# Patient Record
Sex: Female | Born: 1935
Health system: Southern US, Community
[De-identification: ages and names within clinical notes are randomized; demographics above are authoritative.]

## PROBLEM LIST (undated history)

## (undated) DIAGNOSIS — D649 Anemia, unspecified: Secondary | ICD-10-CM

## (undated) DIAGNOSIS — I10 Essential (primary) hypertension: Secondary | ICD-10-CM

## (undated) DIAGNOSIS — T7840XA Allergy, unspecified, initial encounter: Secondary | ICD-10-CM

## (undated) DIAGNOSIS — Z8619 Personal history of other infectious and parasitic diseases: Secondary | ICD-10-CM

## (undated) DIAGNOSIS — M8580 Other specified disorders of bone density and structure, unspecified site: Secondary | ICD-10-CM

## (undated) DIAGNOSIS — R011 Cardiac murmur, unspecified: Secondary | ICD-10-CM

## (undated) HISTORY — DX: Essential (primary) hypertension: I10

## (undated) HISTORY — DX: Other specified disorders of bone density and structure, unspecified site: M85.80

## (undated) HISTORY — DX: Anemia, unspecified: D64.9

## (undated) HISTORY — DX: Personal history of other infectious and parasitic diseases: Z86.19

## (undated) HISTORY — DX: Allergy, unspecified, initial encounter: T78.40XA

## (undated) HISTORY — DX: Cardiac murmur, unspecified: R01.1

---

## 1966-06-26 HISTORY — PX: RIGHT OOPHORECTOMY: SHX2359

## 2009-08-14 ENCOUNTER — Emergency Department (HOSPITAL_BASED_OUTPATIENT_CLINIC_OR_DEPARTMENT_OTHER): Admission: EM | Admit: 2009-08-14 | Discharge: 2009-08-14 | Payer: Self-pay | Admitting: Emergency Medicine

## 2009-08-25 ENCOUNTER — Ambulatory Visit: Payer: Self-pay | Admitting: Family

## 2009-08-25 ENCOUNTER — Ambulatory Visit (HOSPITAL_BASED_OUTPATIENT_CLINIC_OR_DEPARTMENT_OTHER): Admission: RE | Admit: 2009-08-25 | Discharge: 2009-08-25 | Payer: Self-pay | Admitting: Internal Medicine

## 2009-08-25 ENCOUNTER — Ambulatory Visit: Payer: Self-pay | Admitting: Diagnostic Radiology

## 2009-08-25 DIAGNOSIS — J309 Allergic rhinitis, unspecified: Secondary | ICD-10-CM | POA: Insufficient documentation

## 2009-08-25 DIAGNOSIS — D649 Anemia, unspecified: Secondary | ICD-10-CM

## 2009-08-25 DIAGNOSIS — I10 Essential (primary) hypertension: Secondary | ICD-10-CM | POA: Insufficient documentation

## 2009-08-25 LAB — CONVERTED CEMR LAB
ALT: 9 units/L (ref 0–35)
AST: 15 units/L (ref 0–37)
Albumin: 4.1 g/dL (ref 3.5–5.2)
CO2: 30 meq/L (ref 19–32)
Calcium: 9.1 mg/dL (ref 8.4–10.5)
MCV: 82.2 fL (ref 78.0–100.0)
Platelets: 276 10*3/uL (ref 150–400)
RDW: 14.7 % (ref 11.5–15.5)
Total Protein: 7.3 g/dL (ref 6.0–8.3)
WBC: 5 10*3/uL (ref 4.0–10.5)

## 2009-08-26 ENCOUNTER — Encounter (INDEPENDENT_AMBULATORY_CARE_PROVIDER_SITE_OTHER): Payer: Self-pay | Admitting: *Deleted

## 2009-08-27 ENCOUNTER — Telehealth: Payer: Self-pay | Admitting: Family

## 2009-08-27 ENCOUNTER — Encounter: Payer: Self-pay | Admitting: Family

## 2009-08-30 ENCOUNTER — Encounter: Payer: Self-pay | Admitting: Internal Medicine

## 2009-08-30 ENCOUNTER — Ambulatory Visit: Payer: Self-pay | Admitting: Family

## 2009-08-30 ENCOUNTER — Ambulatory Visit: Payer: Self-pay | Admitting: Internal Medicine

## 2009-08-30 LAB — CONVERTED CEMR LAB
LDL Cholesterol: 123 mg/dL — ABNORMAL HIGH (ref 0–99)
Total CHOL/HDL Ratio: 2.7
VLDL: 8 mg/dL (ref 0–40)

## 2009-08-31 ENCOUNTER — Encounter: Payer: Self-pay | Admitting: Family

## 2009-08-31 ENCOUNTER — Encounter (INDEPENDENT_AMBULATORY_CARE_PROVIDER_SITE_OTHER): Payer: Self-pay | Admitting: *Deleted

## 2009-09-02 ENCOUNTER — Encounter: Payer: Self-pay | Admitting: Family

## 2009-09-02 LAB — CONVERTED CEMR LAB: Saturation Ratios: 10 % — ABNORMAL LOW (ref 20–55)

## 2009-09-07 ENCOUNTER — Other Ambulatory Visit: Admission: RE | Admit: 2009-09-07 | Discharge: 2009-09-07 | Payer: Self-pay | Admitting: Internal Medicine

## 2009-09-07 ENCOUNTER — Ambulatory Visit: Payer: Self-pay | Admitting: Family

## 2009-09-07 LAB — CONVERTED CEMR LAB
BUN: 16 mg/dL (ref 6–23)
CO2: 30 meq/L (ref 19–32)
Calcium: 9.5 mg/dL (ref 8.4–10.5)
Creatinine, Ser: 0.79 mg/dL (ref 0.40–1.20)
Glucose, Bld: 120 mg/dL — ABNORMAL HIGH (ref 70–99)
Pap Smear: NEGATIVE
Potassium: 3.8 meq/L (ref 3.5–5.3)

## 2009-09-09 ENCOUNTER — Ambulatory Visit: Payer: Self-pay | Admitting: Internal Medicine

## 2009-09-13 ENCOUNTER — Ambulatory Visit: Payer: Self-pay | Admitting: Family

## 2009-09-21 ENCOUNTER — Ambulatory Visit: Payer: Self-pay | Admitting: Family

## 2009-09-21 LAB — CONVERTED CEMR LAB
Chloride: 101 meq/L (ref 96–112)
Glucose, Bld: 89 mg/dL (ref 70–99)
Sodium: 142 meq/L (ref 135–145)

## 2009-10-05 ENCOUNTER — Ambulatory Visit: Payer: Self-pay | Admitting: Family

## 2009-10-05 ENCOUNTER — Encounter (INDEPENDENT_AMBULATORY_CARE_PROVIDER_SITE_OTHER): Payer: Self-pay | Admitting: *Deleted

## 2009-10-05 DIAGNOSIS — R011 Cardiac murmur, unspecified: Secondary | ICD-10-CM | POA: Insufficient documentation

## 2009-10-05 LAB — CONVERTED CEMR LAB
CO2: 29 meq/L (ref 19–32)
Calcium: 9.4 mg/dL (ref 8.4–10.5)
Chloride: 106 meq/L (ref 96–112)
Creatinine, Ser: 0.8 mg/dL (ref 0.40–1.20)
Glucose, Bld: 94 mg/dL (ref 70–99)

## 2009-10-11 ENCOUNTER — Telehealth: Payer: Self-pay | Admitting: Family

## 2009-10-25 ENCOUNTER — Ambulatory Visit: Payer: Self-pay | Admitting: Cardiology

## 2009-10-25 ENCOUNTER — Ambulatory Visit: Payer: Self-pay

## 2009-10-25 ENCOUNTER — Encounter: Payer: Self-pay | Admitting: Cardiology

## 2009-10-25 ENCOUNTER — Ambulatory Visit (HOSPITAL_COMMUNITY): Admission: RE | Admit: 2009-10-25 | Discharge: 2009-10-25 | Payer: Self-pay | Admitting: Family

## 2009-10-26 ENCOUNTER — Telehealth: Payer: Self-pay | Admitting: Family

## 2009-10-28 ENCOUNTER — Encounter: Payer: Self-pay | Admitting: Family

## 2010-01-28 ENCOUNTER — Ambulatory Visit: Payer: Self-pay | Admitting: Family

## 2010-01-28 LAB — CONVERTED CEMR LAB
Basophils Absolute: 0 10*3/uL (ref 0.0–0.1)
Calcium: 9 mg/dL (ref 8.4–10.5)
Chloride: 104 meq/L (ref 96–112)
Creatinine, Ser: 0.87 mg/dL (ref 0.40–1.20)
Eosinophils Relative: 2 % (ref 0–5)
Lymphs Abs: 1.7 10*3/uL (ref 0.7–4.0)
MCHC: 30.9 g/dL (ref 30.0–36.0)
MCV: 86.2 fL (ref 78.0–100.0)
Monocytes Absolute: 0.3 10*3/uL (ref 0.1–1.0)
Monocytes Relative: 6 % (ref 3–12)
Neutrophils Relative %: 53 % (ref 43–77)
Potassium: 4 meq/L (ref 3.5–5.3)
RBC: 4.05 M/uL (ref 3.87–5.11)
WBC: 4.3 10*3/uL (ref 4.0–10.5)

## 2010-01-31 ENCOUNTER — Telehealth: Payer: Self-pay | Admitting: Family

## 2010-03-09 ENCOUNTER — Telehealth: Payer: Self-pay | Admitting: Family

## 2010-03-30 ENCOUNTER — Ambulatory Visit: Payer: Self-pay | Admitting: Family

## 2010-03-30 DIAGNOSIS — M899 Disorder of bone, unspecified: Secondary | ICD-10-CM

## 2010-03-30 DIAGNOSIS — M949 Disorder of cartilage, unspecified: Secondary | ICD-10-CM

## 2010-03-30 DIAGNOSIS — M8580 Other specified disorders of bone density and structure, unspecified site: Secondary | ICD-10-CM | POA: Insufficient documentation

## 2010-03-30 LAB — CONVERTED CEMR LAB
Basophils Absolute: 0 10*3/uL (ref 0.0–0.1)
Eosinophils Relative: 2 % (ref 0–5)
HCT: 35.3 % — ABNORMAL LOW (ref 36.0–46.0)
Hemoglobin: 11 g/dL — ABNORMAL LOW (ref 12.0–15.0)
Lymphocytes Relative: 38 % (ref 12–46)
Lymphs Abs: 1.7 10*3/uL (ref 0.7–4.0)
Monocytes Relative: 6 % (ref 3–12)
Platelets: 228 10*3/uL (ref 150–400)
WBC: 4.6 10*3/uL (ref 4.0–10.5)

## 2010-04-01 ENCOUNTER — Telehealth: Payer: Self-pay | Admitting: Family

## 2010-04-01 DIAGNOSIS — E559 Vitamin D deficiency, unspecified: Secondary | ICD-10-CM | POA: Insufficient documentation

## 2010-04-04 ENCOUNTER — Ambulatory Visit: Payer: Self-pay | Admitting: Hematology & Oncology

## 2010-04-21 ENCOUNTER — Telehealth: Payer: Self-pay | Admitting: Family

## 2010-05-10 ENCOUNTER — Ambulatory Visit: Payer: Self-pay | Admitting: Hematology & Oncology

## 2010-05-11 ENCOUNTER — Encounter: Payer: Self-pay | Admitting: Family

## 2010-05-11 LAB — CBC WITH DIFFERENTIAL (CANCER CENTER ONLY)
Eosinophils Absolute: 0.1 10*3/uL (ref 0.0–0.5)
HCT: 33.2 % — ABNORMAL LOW (ref 34.8–46.6)
HGB: 11.2 g/dL — ABNORMAL LOW (ref 11.6–15.9)
LYMPH#: 1.7 10*3/uL (ref 0.9–3.3)
MCH: 27.7 pg (ref 26.0–34.0)
MCV: 82 fL (ref 81–101)
MONO#: 0.3 10*3/uL (ref 0.1–0.9)
NEUT#: 2.2 10*3/uL (ref 1.5–6.5)
Platelets: 257 10*3/uL (ref 145–400)
RBC: 4.03 10*6/uL (ref 3.70–5.32)
WBC: 4.3 10*3/uL (ref 3.9–10.0)

## 2010-05-11 LAB — CHCC SATELLITE - SMEAR

## 2010-05-13 LAB — PROTEIN ELECTROPHORESIS, SERUM
Albumin ELP: 54.7 % — ABNORMAL LOW (ref 55.8–66.1)
Alpha-1-Globulin: 4.3 % (ref 2.9–4.9)
Alpha-2-Globulin: 11 % (ref 7.1–11.8)
Beta Globulin: 6 % (ref 4.7–7.2)
Gamma Globulin: 17.7 % (ref 11.1–18.8)
Total Protein, Serum Electrophoresis: 7 g/dL (ref 6.0–8.3)

## 2010-05-13 LAB — COMPREHENSIVE METABOLIC PANEL
ALT: 10 U/L (ref 0–35)
CO2: 29 mEq/L (ref 19–32)
Calcium: 9.3 mg/dL (ref 8.4–10.5)
Chloride: 105 mEq/L (ref 96–112)
Glucose, Bld: 102 mg/dL — ABNORMAL HIGH (ref 70–99)
Sodium: 143 mEq/L (ref 135–145)
Total Protein: 7 g/dL (ref 6.0–8.3)

## 2010-05-13 LAB — IRON AND TIBC
%SAT: 36 % (ref 20–55)
Iron: 117 ug/dL (ref 42–145)
TIBC: 325 ug/dL (ref 250–470)
UIBC: 208 ug/dL

## 2010-05-13 LAB — HAPTOGLOBIN: Haptoglobin: 142 mg/dL (ref 16–200)

## 2010-05-13 LAB — RETICULOCYTES (CHCC)
ABS Retic: 24.5 10*3/uL (ref 19.0–186.0)
RBC.: 4.08 MIL/uL (ref 3.87–5.11)

## 2010-05-13 LAB — VITAMIN B12: Vitamin B-12: 232 pg/mL (ref 211–911)

## 2010-06-03 ENCOUNTER — Ambulatory Visit: Payer: Self-pay | Admitting: Family

## 2010-07-26 NOTE — Assessment & Plan Note (Signed)
Summary: 2 WEEK FOLLOW UP/MHF   Vital Signs:  Patient profile:   75 year old female Height:      64 inches Weight:      155.50 pounds BMI:     26.79 Temp:     98.1 degrees F oral Pulse rate:   84 / minute Pulse rhythm:   regular Resp:     16 per minute BP sitting:   148 / 80  (right arm) Cuff size:   regular  Vitals Entered By: [name] CMA (October 05, 2009 10:29 AM) CC: room 4  2 week follow up   CC:  room 4  2 week follow up.  History of Present Illness: Brandy Stevens is a 75 year old female who presents today for follow up of her HTN.  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  She has no complaints today and is tolerating the medication.  She  has been checking her blood pressure daily at home on her home BP monitor.  Notes readings: 111/60 on Thursday, yesterday BP 140/82, this AM 143/83- notes another reading of 125/73.    Allergies (verified): No Known Drug Allergies  Physical Exam  General:  Well-developed,well-nourished,in no acute distress; alert,appropriate and cooperative throughout examination Lungs:  Normal respiratory effort, chest expands symmetrically. Lungs are clear to auscultation, no crackles or wheezes. Heart:  Grade 2 systolic murmur noted   Impression & Recommendations:  Problem # 1:  HYPERTENSION (ICD-401.9) Assessment Improved BP is improving.  By patient report BP has varied.  Will continue to monitor.  If SBP remains greater than 140 plan to change Tribenzor to 40-5-12.5.  Pt instructed to call in 1 week with BP readings.  Check BMET today Her updated medication list for this problem includes:    Tribenzor 20-5-12.5 Mg Tabs (Olmesartan-amlodipine-hctz) ..... One tablet by mouth daily  BP today: 148/80 Prior BP: 158/84 (09/21/2009)  Labs Reviewed: K+: 4.5 (09/21/2009) Creat: : 0.81 (09/21/2009)   Chol: 206 (08/30/2009)   HDL: 75 (08/30/2009)   LDL: 123 (08/30/2009)   TG: 38 (08/30/2009)  Orders: T-Basic Metabolic Panel  ([id number])  Problem # 2:  HEART MURMUR, SYSTOLIC (ICD-785.2) Assessment: New Patient denies hx or murmur and has never had a 2-D echo.  Will check 2-d Echo to evaluate.   Orders: Misc. Referral (Misc. Ref)  Complete Medication List: 1)  Daily Combo Multivits/calcium Tabs (Multiple vitamins-calcium) .... Take 1 tablet by mouth once a day 2)  Fish Oil 1000 Mg Caps (Omega-3 fatty acids) .... Take 1 capsule by mouth once a day 3)  Ferrous Sulfate 325 (65 Fe) Mg Tabs (Ferrous sulfate) .... Take 1 tablet by mouth once a day 4)  Tribenzor 20-5-12.5 Mg Tabs (Olmesartan-amlodipine-hctz) .... One tablet by mouth daily  Patient Instructions: 1)  Please check Blood pressure daily, call on Monday 4/19 with the results of your daily BP checks.  You may call Bethann Berkshire [phone]. 2)  Keep a low sodium diet. 3)  Please schedule a follow-up appointment in 2 months. Prescriptions: TRIBENZOR 20-5-12.5 MG TABS (OLMESARTAN-AMLODIPINE-HCTZ) one tablet by mouth daily  #30 x 1   Entered and Authorized by:   Lemont Fillers FNP   Signed by:   Lemont Fillers FNP on 10/05/2009   Method used:   Electronically to        [facility] [address] (retail)       [address] [place]  [place], [place]  [address]       Ph: [phone] or [phone]       Fax: [phone]   RxID:   [id number]   Current Allergies (reviewed today): No known allergies

## 2010-07-26 NOTE — Letter (Signed)
Summary: [facility]: Immunoassay Fecal Occult Blood (iFOB) Order Psychologist, counselling at [facility]  [address]   [place], [place] [address]   Phone: [phone]  Fax: [phone]      [facility]: Immunoassay Fecal Occult Blood (iFOB) Order Form   August 31, 2009 MRN: [id number]    Brandy Stevens 09-19-1935    Physicican Name:______Melissa O'Sullivan_____  Diagnosis Code:______285.9___________________      [name] CMA

## 2010-07-26 NOTE — Assessment & Plan Note (Signed)
Summary: pap and blood pressure check/mhf   Vital Signs:  Patient profile:   75 year old female Weight:      152 pounds BMI:     26.19 O2 Sat:      98 % on Room air Temp:     98.2 degrees F oral Pulse rate:   95 / minute Pulse rhythm:   regular Resp:     16 per minute BP sitting:   170 / 80  (right arm) Cuff size:   regular  Vitals Entered By: [name] CMA (September 07, 2009 2:45 PM)  O2 Flow:  Room air CC: room 5  Pt here today for Pap Smear and blood pressure check.   CC:  room 5  Pt here today for Pap Smear and blood pressure check..  History of Present Illness: Brandy Stevens is a 75 year old female who presents today for follow up of her blood pressure and to have her Pap smear.  Last visit she was started on HCTZ and tells me that she has been taking this medication regularly.  She notes that she has not yet started the iron supplement for her anemia, but plans to start this medicine.    Allergies (verified): No Known Drug Allergies  Physical Exam  General:  Well-developed,well-nourished,in no acute distress; alert,appropriate and cooperative throughout examination Head:  Normocephalic and atraumatic without obvious abnormalities. No apparent alopecia or balding. Breasts:  No mass, nodules, thickening, tenderness, bulging, retraction, inflamation, nipple discharge or skin changes noted.   Abdomen:  Bowel sounds positive,abdomen soft and non-tender without masses, organomegaly or hernias noted. Genitalia:  Pelvic Exam:        External: normal female genitalia without lesions or masses        Vagina: normal without lesions or masses        Cervix: normal without lesions or masses        Adnexa: normal bimanual exam without masses or fullness        Uterus: normal by palpation        Pap smear: performed   Impression & Recommendations:  Problem # 1:  HYPERTENSION (ICD-401.9) Assessment Improved  BP is improved today but not yet at goal.  Add Ace, f/u in 2 weeks- pt  advised to d/c med and to to er if tongue/lip swelling. Her updated medication list for this problem includes:    Hydrochlorothiazide 25 Mg Tabs (Hydrochlorothiazide) ..... One tablet by mouth daily    Lisinopril 10 Mg Tabs (Lisinopril) ..... One tablet by mouth daily  BP today: 170/80 Prior BP: 178/100 (08/25/2009)  Labs Reviewed: K+: 4.0 (08/25/2009) Creat: : 0.76 (08/25/2009)   Chol: 206 (08/30/2009)   HDL: 75 (08/30/2009)   LDL: 123 (08/30/2009)   TG: 38 (08/30/2009)  Orders: TLB-BMP (Basic Metabolic Panel-BMET) ([id number]) Prescription Created Electronically [phone])  Complete Medication List: 1)  Daily Combo Multivits/calcium Tabs (Multiple vitamins-calcium) .... Take 1 tablet by mouth once a day 2)  Hydrochlorothiazide 25 Mg Tabs (Hydrochlorothiazide) .... One tablet by mouth daily 3)  Lisinopril 10 Mg Tabs (Lisinopril) .... One tablet by mouth daily  Other Orders: Pelvic & Breast Exam ( Medicare)  (J1914)  Patient Instructions: 1)  Please follow up in 2 weeks for a nurse visit  and BMET 402.9 (labs)to have your blood pressure checked. 2)  We will mail you the results of your pap smear. Prescriptions: LISINOPRIL 10 MG TABS (LISINOPRIL) one tablet by mouth daily  #30 x 0   Entered and  Authorized by:   Lemont Fillers FNP   Signed by:   Lemont Fillers FNP on 09/07/2009   Method used:   Electronically to        [facility] [address] (retail)       [address] [place], [place]  [address]       Ph: [phone] or [phone]       Fax: [phone]   RxID:   [id number]   Current Allergies (reviewed today): No known allergies

## 2010-07-26 NOTE — Letter (Signed)
   Nature conservation officer at [facility] [address] [place], [place] [address]    August 31, 2009   Kaitlynne Bacci [address] [place], [place] [address]  RE:  LAB RESULTS  Dear  Ms. Folger,  The following is an interpretation of your most recent lab tests.  Please take note of any instructions provided or changes to medications that have resulted from your lab work.  ELECTROLYTES:  Good - no changes needed  KIDNEY FUNCTION TESTS:  Good - no changes needed  LIPID PANEL:  Fair - review at your next visit Triglyceride: 38   Cholesterol: 206   LDL: 123   HDL: 75   Chol/HDL%:  2.7 Ratio  THYROID STUDIES:  Thyroid studies normal TSH: 0.849     DIABETIC STUDIES:  Excellent - no changes needed Blood Glucose: 83    CBC:  Fair - review at your next visit Your cholesterol is slightly elevated.  Please eat a low fat, low cholesterol diet.   Also, you are slightly anemic- please arrange a follow up appointment for some additional lab work.   Sincerely Yours,    Lemont Fillers FNP

## 2010-07-26 NOTE — Consult Note (Signed)
Summary: [facility]  [facility]   Imported By: Lanelle Bal 05/24/2010 12:35:51  _____________________________________________________________________  External Attachment:    Type:   Image     Comment:   External Document

## 2010-07-26 NOTE — Assessment & Plan Note (Signed)
Summary: bp check and bmet/mhf   Vital Signs:  Patient profile:   75 year old female Height:      64 inches Weight:      153.50 pounds BMI:     26.44 Temp:     98.4 degrees F oral Pulse rate:   92 / minute Pulse rhythm:   regular Resp:     18 per minute BP sitting:   158 / 84  (right arm) Cuff size:   regular  Vitals Entered By: [name] CMA (September 21, 2009 2:12 PM) CC: room 6  Follow up of blood pressure and needs blood work (bmet) today.   CC:  room 6  Follow up of blood pressure and needs blood work (bmet) today.Marland Kitchen  History of Present Illness: Brandy Stevens is a 75 year old female who presents today for follow up of her blood pressure.  Notes that she has been monitoring her blood pressure at home and has been getting SBP readings of 138-158.  Tolerating meds without difficulty, denies cough or lower extremity swelling.    Allergies: No Known Drug Allergies  Physical Exam  General:  Well-developed,well-nourished,in no acute distress; alert,appropriate and cooperative throughout examination Extremities:  No clubbing, cyanosis, edema, or deformity noted with normal full range of motion of all joints.   Psych:  Cognition and judgment appear intact. Alert and cooperative with normal attention span and concentration. No apparent delusions, illusions, hallucinations   Impression & Recommendations:  Problem # 1:  HYPERTENSION (ICD-401.9) Assessment Improved  BP is improved, but not yet at goal.  Will plan to change to tribenzor, check BMET today, follow up in 2 weeks The following medications were removed from the medication list:    Hydrochlorothiazide 25 Mg Tabs (Hydrochlorothiazide) ..... One tablet by mouth daily    Lisinopril 10 Mg Tabs (Lisinopril) ..... One tablet by mouth daily Her updated medication list for this problem includes:    Tribenzor 20-5-12.5 Mg Tabs (Olmesartan-amlodipine-hctz) ..... One tablet by mouth daily  BP today: 158/84 Prior BP: 170/80  (09/07/2009)  Labs Reviewed: K+: 3.8 (09/07/2009) Creat: : 0.79 (09/07/2009)   Chol: 206 (08/30/2009)   HDL: 75 (08/30/2009)   LDL: 123 (08/30/2009)   TG: 38 (08/30/2009)  Orders: T-Basic Metabolic Panel [id number])  Complete Medication List: 1)  Daily Combo Multivits/calcium Tabs (Multiple vitamins-calcium) .... Take 1 tablet by mouth once a day 2)  Fish Oil 1000 Mg Caps (Omega-3 fatty acids) .... Take 1 capsule by mouth once a day 3)  Ferrous Sulfate 325 (65 Fe) Mg Tabs (Ferrous sulfate) .... Take 1 tablet by mouth once a day 4)  Tribenzor 20-5-12.5 Mg Tabs (Olmesartan-amlodipine-hctz) .... One tablet by mouth daily  Patient Instructions: 1)  Please schedule a follow-up appointment in 2 weeks. Prescriptions: TRIBENZOR 20-5-12.5 MG TABS (OLMESARTAN-AMLODIPINE-HCTZ) one tablet by mouth daily  #30 x 0   Entered and Authorized by:   Brandy Fillers FNP   Signed by:   Brandy Fillers FNP on 09/21/2009   Method used:   Electronically to        [facility] [address] (retail)       [address] [place], [place]  [address]       Ph: [phone] or [phone]       Fax: [phone]   RxID:   [id number]   Current Allergies (reviewed today): No known allergies

## 2010-07-26 NOTE — Letter (Signed)
Summary: [facility] Instructions  [facility]  [address] [place], [place] [address]   Phone: [phone]  Fax: [phone]       Brandy Stevens    1935/09/17    MRN: [id number]        Procedure Day Dorna Bloom:  Lenor Coffin  09/09/09     Arrival Time:  12:30PM     Procedure Time:  1:30PM     Location of Procedure:                    _ X_  [facility] (4th Floor)   PREPARATION FOR COLONOSCOPY WITH MOVIPREP   Starting 5 days prior to your procedure 09/04/09 do not eat nuts, seeds, popcorn, corn, beans, peas,  salads, or any raw vegetables.  Do not take any fiber supplements (e.g. Metamucil, Citrucel, and Benefiber).  THE DAY BEFORE YOUR PROCEDURE         DATE: 09/08/09  DAY: WEDNESDAY  1.  Drink clear liquids the entire day-NO SOLID FOOD  2.  Do not drink anything colored red or purple.  Avoid juices with pulp.  No orange juice.  3.  Drink at least 64 oz. (8 glasses) of fluid/clear liquids during the day to prevent dehydration and help the prep work efficiently.  CLEAR LIQUIDS INCLUDE: Water Jello Ice Popsicles Tea (sugar ok, no milk/cream) Powdered fruit flavored drinks Coffee (sugar ok, no milk/cream) Gatorade Juice: apple, white grape, white cranberry  Lemonade Clear bullion, consomm, broth Carbonated beverages (any kind) Strained chicken noodle soup Hard Candy                             4.  In the morning, mix first dose of MoviPrep solution:    Empty 1 Pouch A and 1 Pouch B into the disposable container    Add lukewarm drinking water to the top line of the container. Mix to dissolve    Refrigerate (mixed solution should be used within 24 hrs)  5.  Begin drinking the prep at 5:00 p.m. The MoviPrep container is divided by 4 marks.   Every 15 minutes drink the solution down to the next mark (approximately 8 oz) until the full liter is complete.   6.  Follow completed prep with 16 oz of clear liquid of your choice (Nothing red or purple).   Continue to drink clear liquids until bedtime.  7.  Before going to bed, mix second dose of MoviPrep solution:    Empty 1 Pouch A and 1 Pouch B into the disposable container    Add lukewarm drinking water to the top line of the container. Mix to dissolve    Refrigerate  THE DAY OF YOUR PROCEDURE      DATE: 09/09/09  DAY: THURSDAY  Beginning at 8:30a.m. (5 hours before procedure):         1. Every 15 minutes, drink the solution down to the next mark (approx 8 oz) until the full liter is complete.  2. Follow completed prep with 16 oz. of clear liquid of your choice.    3. You may drink clear liquids until 11:30AM (2 HOURS BEFORE PROCEDURE).   MEDICATION INSTRUCTIONS  Unless otherwise instructed, you should take regular prescription medications with a small sip of water   as early as possible the morning of your procedure.   Additional medication instructions:Hold HCTZ morning of procedure.         OTHER  INSTRUCTIONS  You will need a responsible adult at least 73 years of age to accompany you and drive you home.   This person must remain in the waiting room during your procedure.  Wear loose fitting clothing that is easily removed.  Leave jewelry and other valuables at home.  However, you may wish to bring a book to read or  an iPod/MP3 player to listen to music as you wait for your procedure to start.  Remove all body piercing jewelry and leave at home.  Total time from sign-in until discharge is approximately 2-3 hours.  You should go home directly after your procedure and rest.  You can resume normal activities the  day after your procedure.  The day of your procedure you should not:   Drive   Make legal decisions   Operate machinery   Drink alcohol   Return to work  You will receive specific instructions about eating, activities and medications before you leave.    The above instructions have been reviewed and explained to me by   Ezra Sites RN   August 30, 2009 2:58 PM    I fully understand and can verbalize these instructions _____________________________ Date _________

## 2010-07-26 NOTE — Letter (Signed)
Summary: Primary Care Consult Scheduled Letter  [place] [facility]  [address]   [place], [place] [address]   Phone: [phone]  Fax: [phone]      10/05/2009 MRN: [id number]  Brandy Stevens [address] [place], [place]  [address]    Dear [name],      We have scheduled an appointment for you.  At the recommendation of MELISSA O'SULLIVAN,FNP, we have scheduled you a 2D-ECHO ,[facility] on MAY 2  at 11:30AM .  Their address is_[facility],  [address] , [place] . The office phone number is [phone].  If this appointment day and time is not convenient for you, please feel free to call the office of the doctor you are being referred to at the number listed above and reschedule the appointment.     It is important for you to keep your scheduled appointments. We are here to make sure you are given good patient care. If you have questions or you have made changes to your appointment, please notify us at  [phone], ask for HELEN.    Thank you, Darral Dash Patient Care Coordinator [place] at [facility]

## 2010-07-26 NOTE — Progress Notes (Signed)
Summary: lab result, hematology referral  Phone Note Outgoing Call   Summary of Call: Please call patient and let her know that her iron is normal.  She should continue iron supplement.  She remains mildly anemic.  I would like for her to see Dr. Myna Hidalgo (hematology) to see if he has any further suggestions.  Also, her vitamin D is a little low.  I would like her to add a vitamin D supplement as below.   Initial call taken by: Lemont Fillers FNP,  April 01, 2010 8:18 AM  Follow-up for Phone Call        Notified pt of Jaceon Heiberger instructions. Pt stated she wanted to wait until December after checking labs again.  Advised pt that the anemia was present from 3/11 at her first visit with [place]. Reviewed iron supplement directions and pt states that she has only been taking her iron two times a day. Please advise. Nicki Guadalajara Fergerson CMA [facility] Dull)  April 01, 2010 8:58 AM   Additional Follow-up for Phone Call Additional follow up Details #1::        Her iron is normal.  I don't expect that her blood count is going to improve between now and December and she has bee anemic for a long time  I would advise her to see Dr. Myna Hidalgo sooner than later.  I want to make sure that we aren't missing a more serious cause for her anemia. You can let her know that his office is right across the hall from ours. Additional Follow-up by: Lemont Fillers FNP,  April 01, 2010 9:08 AM  New Problems: VITAMIN D DEFICIENCY (ICD-268.9)   Additional Follow-up for Phone Call Additional follow up Details #2::    Notified pt per Wendy Hoback's instructions. Pt voices understanding. Nicki Guadalajara Fergerson CMA Duncan Dull)  April 01, 2010 9:35 AM   New Problems: VITAMIN D DEFICIENCY (ICD-268.9) New/Updated Medications: VITAMIN D 1000 UNIT TABS (CHOLECALCIFEROL) 3 caps by mouth once daily

## 2010-07-26 NOTE — Progress Notes (Signed)
  Phone Note Outgoing Call   Summary of Call: Left message with patient's family member for her to return my call.  When she calls back I would like to let her know that her echocardiogram looks good.  Initial call taken by: Lemont Fillers FNP,  Oct 26, 2009 11:15 AM

## 2010-07-26 NOTE — Assessment & Plan Note (Signed)
Summary: FU VISIT/DT--Rm 5   Vital Signs:  Patient profile:   75 year old female Height:      64 inches Weight:      158.25 pounds Temp:     98.2 degrees F oral Pulse rate:   72 / minute Pulse rhythm:   regular Resp:     16 per minute BP sitting:   140 / 78  (right arm)  Vitals Entered By: [name] CMA Duncan Dull) (January 28, 2010 1:17 PM)   CC:  Room 5  Follow up. Pt has bp monitor from home to compare readings. and Hypertension Management.  History of Present Illness: Brandy Stevens is a 75 year old female who presents today for follow up of her blood pressure.  1) HTN- Notes BP readings at home have ranged 121-130's/70's.   She is tolerating her medication without difficulty.   2) Anemia- reports that she continues to take iron.  Had negative colonoscopy.  Hypertension History:      She denies headache, chest pain, palpitations, dyspnea with exertion, and peripheral edema.        Positive major cardiovascular risk factors include female age 16 years old or older and hypertension.  Negative major cardiovascular risk factors include non-tobacco-user status.     Allergies (verified): No Known Drug Allergies  Past History:  Past Medical History: Last updated: September 24, 2009 chicken pox UTI Allergic rhinitis Anemia-NOS Hypertension  Past Surgical History: Last updated: 2009-09-24 Right oophorectomy 1968  Family History: Last updated: 24-Sep-2009 Mom- deceased,  died from complications of hip fracture- 73 Dad- deceased, died at 80, ?pneumonia 7 brothers- two deceased (oldest brother died suddenly- cause unknown)  other brother died from ? pneumonia 5 living brothers- alive and well  One son age 69- alive and well  Social History: Last updated: 09/24/2009 Works as an Public house manager at [employer] Never smoked denies ETOH denies drug use never smoked widowed  Risk Factors: Smoking Status: never (2009-09-24)  Review of Systems       see HPI   Physical  Exam  General:  Well-developed,well-nourished,in no acute distress; alert,appropriate and cooperative throughout examination Head:  Normocephalic and atraumatic without obvious abnormalities. No apparent alopecia or balding. Neck:  No deformities, masses, or tenderness noted. Lungs:  Normal respiratory effort, chest expands symmetrically. Lungs are clear to auscultation, no crackles or wheezes. Heart:  Normal rate and regular rhythm. S1 and S2 normal.  + systolic murmur. Extremities:  No LE edema Psych:  Oriented X3.     Impression & Recommendations:  Problem # 1:  HYPERTENSION (ICD-401.9) Assessment Unchanged Home BP readings are lower than here.  Will plan to have patient continue current dose of tribenzor.  Check BMET.   Her updated medication list for this problem includes:    Tribenzor 20-5-12.5 Mg Tabs (Olmesartan-amlodipine-hctz) ..... One tablet by mouth daily  Orders: T-Basic Metabolic Panel [id number])  BP today: 140/78 Prior BP: 148/80 (10/05/2009)  10 Yr Risk Heart Disease: 9 %  Labs Reviewed: K+: 4.4 (10/05/2009) Creat: : 0.80 (10/05/2009)   Chol: 206 (08/30/2009)   HDL: 75 (08/30/2009)   LDL: 123 (08/30/2009)   TG: 38 (08/30/2009)  Problem # 2:  ANEMIA-NOS (ICD-285.9) Assessment: Comment Only check follow up CBC.  Continue Iron. Patient had negative FOB and negative colo. Her updated medication list for this problem includes:    Ferrous Sulfate 325 (65 Fe) Mg Tabs (Ferrous sulfate) .Marland Kitchen... Take 1 tablet by mouth once a day  Orders: T-CBC w/Diff ([id number])  Complete Medication List: 1)  Daily Combo Multivits/calcium Tabs (Multiple vitamins-calcium) .... Take 1 tablet by mouth once a day 2)  Fish Oil 1000 Mg Caps (Omega-3 fatty acids) .... Take 1 capsule by mouth once a day 3)  Ferrous Sulfate 325 (65 Fe) Mg Tabs (Ferrous sulfate) .... Take 1 tablet by mouth once a day 4)  Tribenzor 20-5-12.5 Mg Tabs (Olmesartan-amlodipine-hctz) .... One tablet by mouth  daily  Hypertension Assessment/Plan:      The patient's hypertensive risk group is category B: At least one risk factor (excluding diabetes) with no target organ damage.  Her calculated 10 year risk of coronary heart disease is 9 %.  Today's blood pressure is 140/78.  Her blood pressure goal is < 140/90.  Patient Instructions: 1)  Please complete your blood work downstairs. 2)  Follow up in 3 months.    Current Allergies (reviewed today): No known allergies   CC: Room 5  Follow up. Pt has bp monitor from home to compare readings., Hypertension Management Is Patient Diabetic? No Comments Pt states that she takes multi vit. every other day.  Brandy Stevens CMA (AAMA)  January 28, 2010 1:24 PM

## 2010-07-26 NOTE — Letter (Signed)
   [facility] [address] [place], [place]  [address]  [place] Phone: [phone]      September 13, 2009   Brandy Stevens [address] [place], [place] [address]  RE:  LAB RESULTS  Dear  Ms. Stingley,  The following is an interpretation of your most recent lab tests.  Please take note of any instructions provided or changes to medications that have resulted from your lab work.  Pap Smear: normal     Sincerely Yours,    Lemont Fillers FNP

## 2010-07-26 NOTE — Letter (Signed)
   [facility] [address] [place], [place]  [address]  [place] Phone: [phone]      Oct 28, 2009   Zyann Tretter [address] [place], [place] [address]  RE:  LAB RESULTS  Dear  Ms. Dimaano,  The following is an interpretation of your most recent lab tests.  Please take note of any instructions provided or changes to medications that have resulted from your lab work.   Your echocardiogram was normal. Please follow up in July as scheduled.   Sincerely Yours,    Lemont Fillers FNP

## 2010-07-26 NOTE — Miscellaneous (Signed)
  Clinical Lists Changes  Observations: Added new observation of MAMMOGRAM: normal (08/25/2009 12:39)      Preventive Care Screening  Mammogram:    Date:  08/25/2009    Results:  normal

## 2010-07-26 NOTE — Assessment & Plan Note (Signed)
Summary: new to be est - jr   Vital Signs:  Patient profile:   75 year old female Height:      64 inches Weight:      155.4 pounds BMI:     26.77 O2 Sat:      100 % on Room air Temp:     98.0 degrees F oral Pulse rate:   92 / minute Pulse rhythm:   regular Resp:     16 per minute BP sitting:   178 / 100  (right arm) Cuff size:   regular  Vitals Entered By: [name] CMA (August 25, 2009 1:44 PM)  O2 Flow:  Room air Is Patient Diabetic? No   History of Present Illness: Brandy Stevens is a 75 yr old female who presents here today to establish care.  Has been without a primary care physician for more than 5 yrs.  She saw Dr Colon Branch in the past and he retired.  Since that time she has gone to urgent cares and ER's as needed.  HTN- [facility] due to eye irritation following an episode of shampoo getting into her eye.  Also notes that her BP was "sky high" when she saw her dentist about 1 month ago.  She is not currently on a blood pressure medication.  Preventative-  last mammogram was greater than 5 years ago.  Colo- greater than 10 yrs ago.  Last tetanus was greater than 10 yrs ago.  Never had dexascan.  Last pap was "years ago".  Used to walk with her husband-  has not been walking   regularly.  Currently declines pneumovax.  Preventive Screening-Counseling & Management  Alcohol-Tobacco     Smoking Status: never  Allergies (verified): No Known Drug Allergies  Past History:  Past Medical History: chicken pox UTI Allergic rhinitis Anemia-NOS Hypertension  Past Surgical History: Right oophorectomy 1968  Family History: Mom- deceased,  died from complications of hip fracture- 63 Dad- deceased, died at 41, ?pneumonia 7 brothers- two deceased (oldest brother died suddenly- cause unknown)  other brother died from ? pneumonia 5 living brothers- alive and well  One son age 38- alive and well  Social History: Works as an Public house manager at  [employer] Never smoked denies ETOH denies drug use never smoked widowedSmoking Status:  never  Review of Systems       Constitutional: Denies Fever ENT:  Denies nasal congestion or sore throat. Resp: Denies cough CV:  Denies Chest Pain or SOB GI:  Denies nausea or vomitting GU: Denies dysuria Lymphatic: Denies lymphadenopathy Musculoskeletal:  Denies muscle/joint pain Skin:  Denies Rashes Psychiatric: Denies depression, occasional anxiety at work Neuro: Denies numbness     Physical Exam  General:  Slightly overweight AA female, awake, alert, NAD. appears younger than stated age Head:  Normocephalic and atraumatic without obvious abnormalities. No apparent alopecia or balding. Neck:  No deformities, masses, or tenderness noted. Breasts:  deferred to next visit Lungs:  Normal respiratory effort, chest expands symmetrically. Lungs are clear to auscultation, no crackles or wheezes. Heart:  Normal rate and regular rhythm. S1 and S2 normal without gallop, murmur, click, rub or other extra sounds. Abdomen:  Bowel sounds positive,abdomen soft and non-tender without masses, organomegaly or hernias noted. Genitalia:  deferred to next visit Msk:  No deformity or scoliosis noted of thoracic or lumbar spine.   Extremities:  No clubbing, cyanosis, edema, or deformity noted with normal  full range of motion of all joints.   Skin:  Intact without suspicious lesions or rashes Cervical Nodes:  No lymphadenopathy noted Psych:  Cognition and judgment appear intact. Alert and cooperative with normal attention span and concentration. No apparent delusions, illusions, hallucinations   Impression & Recommendations:  Problem # 1:  Preventive Health Care (ICD-V70.0) Will refer for colo, and mammogram.  Check baseline laboratories.  Patient counselled on diet exercise and weight loss.  Immunizations reviewed.  Tetanus today, refuses pneumovax. Orders: Mammogram (Screening)  (Mammo) T-Comprehensive Metabolic Panel [phone]) T-TSH [phone]) EKG w/ Interpretation (93000) Gastroenterology Referral (GI)  Problem # 2:  HYPERTENSION (ICD-401.9) Assessment: New Check BMET today, will start HCTZ, plan f/u in 2 weeks Her updated medication list for this problem includes:    Hydrochlorothiazide 25 Mg Tabs (Hydrochlorothiazide) ..... One tablet by mouth daily  Problem # 3:  ANEMIA-NOS (ICD-285.9) Assessment: Comment Only Notes remote hx of anemia- check CBC Orders: T-CBC No Diff ([id number])  Problem # 4:  ALLERGIC RHINITIS (ICD-477.9) Assessment: Improved Currently stable, monitor  Complete Medication List: 1)  Daily Combo Multivits/calcium Tabs (Multiple vitamins-calcium) .... Take 1 tablet by mouth once a day 2)  Hydrochlorothiazide 25 Mg Tabs (Hydrochlorothiazide) .... One tablet by mouth daily  Other Orders: Tdap => 63yrs IM (29562) Admin 1st Vaccine ([address])  Patient Instructions: 1)  Please schedule bone density (Dexa) test (V70) 2)  Please return fasting for cholesterol (FLP- V70) 3)  You will be called about your referral for colonoscopy 4)  Please schedule your mammogram downstairs prior to leaving today. 5)  Please return in 2 weeks for PAP smear and blood pressure check 6)  It is important that you exercise regularly at least 20 minutes 5 times a week. If you develop chest pain, have severe difficulty breathing, or feel very tired , stop exercising immediately and seek medical attention. 7)  You need to lose weight. Consider a lower calorie diet and regular exercise.  Prescriptions: HYDROCHLOROTHIAZIDE 25 MG TABS (HYDROCHLOROTHIAZIDE) one tablet by mouth daily  #30 x 0   Entered and Authorized by:   Lemont Fillers FNP   Signed by:   Lemont Fillers FNP on 08/25/2009   Method used:   Electronically to        [facility] [address] (retail)       [address] [place], [place]  [address]       Ph:  [phone] or [phone]       Fax: [phone]   RxID:   [id number]      Immunization History:  Influenza Immunization History:    Influenza:  historical (04/14/2009)  Immunizations Administered:  Tetanus Vaccine:    Vaccine Type: Tdap    Site: right deltoid    Mfr: GlaxoSmithKline    Dose: 0.5 ml    Route: IM    Given by: [name] CMA    Exp. Date: 08/21/2011    Lot #: [id number]   Current Allergies (reviewed today): No known allergies    Vital Signs:  Patient Profile:   75 year old female Height:     64 inches Weight:      155.4 pounds BMI:     26.77 O2 Sat:      100 % Temp:     98.0 degrees F oral Pulse rate:   92 / minute Pulse rhythm:   regular Resp:  16 per minute BP sitting:   178 / 100 Cuff size:   regular

## 2010-07-26 NOTE — Progress Notes (Signed)
Summary: Tribenzor refill  Phone Note Refill Request Call back at Home Phone [phone] Message from:  Patient on March 09, 2010 12:12 PM  Refills Requested: Medication #1:  TRIBENZOR 20-5-12.5 MG TABS one tablet by mouth daily.   Dosage confirmed as above?Dosage Confirmed   Brand Name Necessary? No   Supply Requested: 1 month   Last Refilled: 01/14/2010 [facility] [address]   Method Requested: Electronic Next Appointment Scheduled: 10.5.11 Initial call taken by: Lannette Donath,  March 09, 2010 12:12 PM    Prescriptions: TRIBENZOR 20-5-12.5 MG TABS [facility]) one tablet by mouth daily  #30 Tablet x 0   Entered by:   [name] CMA (AAMA)   Authorized by:   Lemont Fillers FNP   Signed by:   [name] CMA (AAMA) on 03/09/2010   Method used:   Faxed to ...       [facility] [id number] (retail)       [address]       [place], [place]  [address]       Ph: [phone] or [phone]       Fax: [phone]   RxID:   [id number]

## 2010-07-26 NOTE — Assessment & Plan Note (Signed)
Summary: 2 month f/u / tf,cma   Vital Signs:  Patient profile:   75 year old female Height:      64 inches Weight:      157.75 pounds BMI:     27.18 O2 Sat:      100 % on Room air Temp:     97.8 degrees F oral Pulse rate:   87 / minute Pulse rhythm:   regular Resp:     16 per minute BP sitting:   140 / 90  (right arm) Cuff size:   regular  Vitals Entered By: Glendell Docker CMA (March 30, 2010 2:30 PM)  O2 Flow:  Room air CC: 2 month follow up , Hypertension Management Is Patient Diabetic? No Pain Assessment Patient in pain? no      Comments flu vaccine with employer, no concerns   CC:  2 month follow up  and Hypertension Management.  History of Present Illness: Brandy Stevens is a 75 year old female who presents today for follow up of her blood pressure.  Notes that her BP's have been running 130-140/80-90.    Anemia- has been trying to eat more red meats/green leafy vegetables.  Notes occasional constipation with colace.  Taking iron as prescribed.  Hypertension History:      She denies headache, chest pain, palpitations, and peripheral edema.        Positive major cardiovascular risk factors include female age 73 years old or older and hypertension.  Negative major cardiovascular risk factors include non-tobacco-user status.     Preventive Screening-Counseling & Management  Alcohol-Tobacco     Smoking Status: never  Problems Prior to Update: 1)  Osteopenia  (ICD-733.90) 2)  Heart Murmur, Systolic  (ICD-785.2) 3)  Preventive Health Care  (ICD-V70.0) 4)  Hypertension  (ICD-401.9) 5)  Anemia-nos  (ICD-285.9) 6)  Allergic Rhinitis  (ICD-477.9)  Allergies (verified): No Known Drug Allergies  Past History:  Past Medical History: Last updated: 08/25/2009 chicken pox UTI Allergic rhinitis Anemia-NOS Hypertension  Review of Systems       see HPI  Physical Exam  General:  Well-developed,well-nourished,in no acute distress; alert,appropriate and  cooperative throughout examination Lungs:  Normal respiratory effort, chest expands symmetrically. Lungs are clear to auscultation, no crackles or wheezes. Heart:  Grade II/VI systolic murmur, I6/N6, RRR Extremities:  trace left pedal edema and trace right pedal edema.     Impression & Recommendations:  Problem # 1:  ANEMIA-NOS (ICD-285.9) Assessment Comment Only Pt with neg IFOB, negative colo, iron deficiency.  Repeat CBC and iron level. Her updated medication list for this problem includes:    Ferrous Sulfate 325 (65 Fe) Mg Tabs (Ferrous sulfate) .Marland Kitchen... Take 1 tablet by mouth three times a day  Orders: TLB-CBC Platelet - w/Differential (85025-CBCD) T-Iron (29528-41324)  Problem # 2:  HYPERTENSION (ICD-401.9) Assessment: Comment Only At goal, continue tribenzor. Her updated medication list for this problem includes:    Tribenzor 20-5-12.5 Mg Tabs (Olmesartan-amlodipine-hctz) ..... One tablet by mouth daily  BP today: 140/90 Prior BP: 140/78 (01/28/2010)  Prior 10 Yr Risk Heart Disease: 9 % (01/28/2010)  Labs Reviewed: K+: 4.0 (01/28/2010) Creat: : 0.87 (01/28/2010)   Chol: 206 (08/30/2009)   HDL: 75 (08/30/2009)   LDL: 123 (08/30/2009)   TG: 38 (08/30/2009)  Complete Medication List: 1)  Daily Combo Multivits/calcium Tabs (Multiple vitamins-calcium) .... Take 1 tablet by mouth once a day 2)  Fish Oil 1000 Mg Caps (Omega-3 fatty acids) .... Take 1 capsule by mouth  once a day 3)  Ferrous Sulfate 325 (65 Fe) Mg Tabs (Ferrous sulfate) .... Take 1 tablet by mouth three times a day 4)  Tribenzor 20-5-12.5 Mg Tabs (Olmesartan-amlodipine-hctz) .... One tablet by mouth daily 5)  Colace 100 Mg Caps (Docusate sodium) .... One cap by mouth 1-2x daily as needed  Other Orders: T-Assay of Vitamin D ([id number])  Hypertension Assessment/Plan:      The patient's hypertensive risk group is category B: At least one risk factor (excluding diabetes) with no target organ damage.  Her  calculated 10 year risk of coronary heart disease is 9 %.  Today's blood pressure is 140/90.  Her blood pressure goal is < 140/90.  Patient Instructions: 1)  Please follow up in 3 months. 2)  Have a nice Fall!  Current Allergies (reviewed today): No known allergies

## 2010-07-26 NOTE — Progress Notes (Signed)
Summary: BP readings  Phone Note Call from Patient   Caller: Patient Call For: Lemont Fillers FNP Summary of Call: Pt called with a.m. BP readings:  4/13-- 115/69                                              4/14-- 130/77                                              4/15-- 134/77                                              4/16-- 132/72                                              4/17-- 132/72                                              4/18-- 126/77 Pt states that her readings in the p.m. have been about the same.  I advised pt. to bring her monitor to her next visit for [place] to compare with our equipment.  [name] CMA  October 11, 2009 10:31 AM    Follow-up for Phone Call        Called pt. reviewed results.  Plan to continue current meds. Follow-up by: Lemont Fillers FNP,  October 11, 2009 1:36 PM

## 2010-07-26 NOTE — Miscellaneous (Signed)
Summary: [facility]  Clinical Lists Changes  Medications: Added new medication of MOVIPREP 100 GM  SOLR (PEG-KCL-NACL-NASULF-NA ASC-C) As per prep instructions. - Signed Rx of MOVIPREP 100 GM  SOLR (PEG-KCL-NACL-NASULF-NA ASC-C) As per prep instructions.;  #1 x 0;  Signed;  Entered by: Ezra Sites RN;  Authorized by: Hart Carwin MD;  Method used: Electronically to [facility] [id number] [address] [place], [place], [place]  [address], Ph: [phone] or [phone], Fax: [phone] Observations: Added new observation of NKA: T (08/30/2009 13:57)    Prescriptions: MOVIPREP 100 GM  SOLR (PEG-KCL-NACL-NASULF-NA ASC-C) As per prep instructions.  #1 x 0   Entered by:   Ezra Sites RN   Authorized by:   Hart Carwin MD   Signed by:   Ezra Sites RN on 08/30/2009   Method used:   Electronically to        [facility] [address] (retail)       [address] [place], [place]  [address]       Ph: [phone] or [phone]       Fax: [phone]   RxID:   [id number]

## 2010-07-26 NOTE — Miscellaneous (Signed)
Summary: BONE DENSITY  Clinical Lists Changes  Orders: Added new Test order of T-Bone Densitometry (77080) - Signed Added new Test order of T-Lumbar Vertebral Assessment (77082) - Signed 

## 2010-07-26 NOTE — Progress Notes (Signed)
Summary: Tribenzor refill  Phone Note Refill Request Call back at Home Phone [phone] Message from:  Patient on April 21, 2010 3:00 PM  Refills Requested: Medication #1:  TRIBENZOR 20-5-12.5 MG TABS one tablet by mouth daily   Dosage confirmed as above?Dosage Confirmed   Brand Name Necessary? No   Supply Requested: 1 month [facility]   Method Requested: Electronic Next Appointment Scheduled: 12.9.11 Initial call taken by: Lannette Donath,  April 21, 2010 3:01 PM  Follow-up for Phone Call        Pt notified rx sent to pharmacy. Nicki Guadalajara Fergerson CMA [place] [facility])  April 21, 2010 3:05 PM     Prescriptions: Marya Landry 20-5-12.5 MG TABS [facility]) one tablet by mouth daily  #30 Tablet x 2   Entered by:   [name] CMA (AAMA)   Authorized by:   Lemont Fillers FNP   Signed by:   [name] CMA (AAMA) on 04/21/2010   Method used:   Electronically to        [facility] [address] (retail)       [address]       [place], [place]  [address]       Ph: [phone]       Fax: [phone]   RxID:   [id number]

## 2010-07-26 NOTE — Progress Notes (Signed)
Summary: lab results  Phone Note Outgoing Call   Summary of Call: Please call patient and let her know that she is still anemic.  I would like for her to increase her iron to three times a day please.  She should follow up in 2 months. Initial call taken by: Lemont Fillers FNP,  January 31, 2010 8:26 AM  Follow-up for Phone Call        Pt advised per [facility] instruction and voices understanding.  Follow up scheduled for 03/30/10 @ 2:30 with Melissa. Appt reminder mailed to pt. Nicki Guadalajara Fergerson CMA (AAMA)  January 31, 2010 1:04 PM     New/Updated Medications: FERROUS SULFATE 325 (65 FE) MG TABS (FERROUS SULFATE) Take 1 tablet by mouth three times a day

## 2010-07-26 NOTE — Procedures (Signed)
Summary: Colonoscopy  Patient: [name] Note: All result statuses are Final unless otherwise noted.  Tests: (1) Colonoscopy (COL)   COL Colonoscopy           [facility]ONE      [facility]     [address]     [place], [place]  [address]           COLONOSCOPY PROCEDURE REPORT           PATIENT:  Brandy Stevens, Brandy Stevens  MR#:  [id number]     BIRTHDATE:  08/07/1935, 74 yrs. old  GENDER:  female           ENDOSCOPIST:  Hedwig Morton. Juanda Chance, MD     Referred by:  Sandford Craze, FNP           PROCEDURE DATE:  09/09/2009     PROCEDURE:  Colonoscopy [address]     ASA CLASS:  Class I     INDICATIONS:  Routine Risk Screening           MEDICATIONS:   Versed 8 mg, Fentanyl 75 mcg           DESCRIPTION OF PROCEDURE:   After the risks benefits and     alternatives of the procedure were thoroughly explained, informed     consent was obtained.  Digital rectal exam was performed and     revealed no rectal masses.   The LB [id number] endoscope     was introduced through the anus and advanced to the cecum, which     was identified by both the appendix and ileocecal valve, without     limitations.  The quality of the prep was good, using MoviPrep.     The instrument was then slowly withdrawn as the colon was fully     examined.     <<PROCEDUREIMAGES>>           FINDINGS:  No polyps or cancers were seen (see image1, image2,     image3, image4, and image5). difficult exam due to tortuosity,     scope induced trauma due to pressure in the sigmoid colon     Retroflexed views in the rectum revealed no abnormalities.    The     scope was then withdrawn from the patient and the procedure     completed.           COMPLICATIONS:  None           ENDOSCOPIC IMPRESSION:     1) No polyps or cancers     RECOMMENDATIONS:     1) high fiber diet           REPEAT EXAM:  In 10 year(s) for.           ______________________________     Hedwig Morton. Juanda Chance, MD           CC:           n.     eSIGNED:   Hedwig Morton. Ivann Trimarco at 09/09/2009 02:14 PM           Canary Brim, [phone]  Note: An exclamation mark (!) indicates a result that was not dispersed into the flowsheet. Document Creation Date: 09/09/2009 2:15 PM _______________________________________________________________________  (1) Order result status: Final Collection or observation date-time: 09/09/2009 14:09 Requested date-time:  Receipt date-time:  Reported date-time:  Referring Physician:   Ordering Physician: Lina Sar [phone]) Specimen Source:  Source: Launa Grill Order Number: [id number] Lab site:  Appended Document: Colonoscopy    Clinical Lists Changes  Observations: Added new observation of COLONNXTDUE: 08/2019 (09/09/2009 16:05)

## 2010-07-26 NOTE — Progress Notes (Signed)
Summary: lab result, IFOB kit  Phone Note Outgoing Call   Summary of Call: patient is anemic, pls have her return for B12, Folate, iron,ferritin, TIBC, and IFOB (285.9)  Initial call taken by: Lemont Fillers FNP,  August 27, 2009 5:59 PM  Follow-up for Phone Call        Left message on machine to return call. Follow-up by: [name] CMA,  August 30, 2009 11:45 AM  Additional Follow-up for Phone Call Additional follow up Details #1::        Notified pt. of need for additional labs. Appt. scheduled for 09/02/09 for labs. Pt instructed to come upstairs to pick up IFOB kit and instructions after lab appt. Additional Follow-up by: [name] CMA,  August 31, 2009 4:52 PM

## 2010-10-13 ENCOUNTER — Other Ambulatory Visit: Payer: Self-pay | Admitting: Hematology & Oncology

## 2010-10-13 ENCOUNTER — Encounter: Payer: Self-pay | Admitting: Family

## 2010-10-13 ENCOUNTER — Encounter (HOSPITAL_BASED_OUTPATIENT_CLINIC_OR_DEPARTMENT_OTHER): Payer: Federal, State, Local not specified - PPO | Admitting: Hematology & Oncology

## 2010-10-13 DIAGNOSIS — D649 Anemia, unspecified: Secondary | ICD-10-CM

## 2010-10-13 LAB — IRON AND TIBC
%SAT: 17 % — ABNORMAL LOW (ref 20–55)
Iron: 53 ug/dL (ref 42–145)
TIBC: 320 ug/dL (ref 250–470)
UIBC: 267 ug/dL

## 2010-10-13 LAB — CBC WITH DIFFERENTIAL (CANCER CENTER ONLY)
Eosinophils Absolute: 0.1 10*3/uL (ref 0.0–0.5)
HCT: 36.8 % (ref 34.8–46.6)
LYMPH#: 1.5 10*3/uL (ref 0.9–3.3)
LYMPH%: 31.9 % (ref 14.0–48.0)
MCH: 27.2 pg (ref 26.0–34.0)
MONO#: 0.4 10*3/uL (ref 0.1–0.9)
MONO%: 8.1 % (ref 0.0–13.0)
NEUT#: 2.6 10*3/uL (ref 1.5–6.5)
RDW: 14.3 % (ref 11.1–15.7)

## 2010-10-13 LAB — RETICULOCYTES (CHCC)
RBC.: 4.29 MIL/uL (ref 3.87–5.11)
Retic Ct Pct: 0.9 % (ref 0.4–3.1)

## 2010-10-13 LAB — FERRITIN: Ferritin: 48 ng/mL (ref 10–291)

## 2010-10-14 ENCOUNTER — Ambulatory Visit (INDEPENDENT_AMBULATORY_CARE_PROVIDER_SITE_OTHER): Payer: Federal, State, Local not specified - PPO | Admitting: Family

## 2010-10-14 ENCOUNTER — Encounter: Payer: Self-pay | Admitting: Family

## 2010-10-14 DIAGNOSIS — D649 Anemia, unspecified: Secondary | ICD-10-CM

## 2010-10-14 DIAGNOSIS — I10 Essential (primary) hypertension: Secondary | ICD-10-CM

## 2010-10-14 DIAGNOSIS — E559 Vitamin D deficiency, unspecified: Secondary | ICD-10-CM

## 2010-10-14 LAB — BASIC METABOLIC PANEL: Glucose, Bld: 79 mg/dL (ref 70–99)

## 2010-10-14 MED ORDER — OLMESARTAN-AMLODIPINE-HCTZ 20-5-12.5 MG PO TABS: 1.0000 | ORAL_TABLET | Freq: Every day | ORAL | Status: DC

## 2010-10-14 NOTE — Assessment & Plan Note (Signed)
Deteriorated.  She is off of the tribenzor.  Slight bibasilar crackles- though asymptomatic.  Hopefully resuming the HCTZ will help with what appears to be a mild volume overload.  Pt compliance was reinforced today.  Resume Tribenzor.

## 2010-10-14 NOTE — Progress Notes (Signed)
  Subjective:    Patient ID: Brandy Stevens, female    DOB: Dec 27, 1935, 75 y.o.   MRN: [id number]  HPI  Brandy Stevens is 76 yr old female who presents today for follow up.  1)HTN-  Reports that her BP has been running high.  Has not taken her BP meds due to "ran out."    2)Iron deficiency anemia-Saw Dr. Myna Hidalgo yesterday.  He reports everything is "ok" per pt.  Notes that he wants her to continue her PO iron and add a baby aspirin.  3) Vitamin D deficiency- she continues a MVI and vitamin D supplement.    Review of Systems  Constitutional: Negative for fever.  Respiratory: Negative for cough and shortness of breath.   Cardiovascular: Negative for chest pain and leg swelling.       Objective:   Physical Exam  Constitutional: She appears well-developed and well-nourished.  Cardiovascular: Normal rate and regular rhythm.   Murmur heard. Pulmonary/Chest: Effort normal.       Fine crackles noted at bilateral bases.  Musculoskeletal: She exhibits no edema.          Assessment & Plan:

## 2010-10-14 NOTE — Assessment & Plan Note (Signed)
This is being followed by Dr. Myna Hidalgo.  Pt reports that this is well controlled.

## 2010-10-14 NOTE — Assessment & Plan Note (Signed)
Will check vitamin-D level.

## 2010-10-16 ENCOUNTER — Telehealth: Payer: Self-pay | Admitting: Family

## 2010-10-16 MED ORDER — ERGOCALCIFEROL 1.25 MG (50000 UT) PO CAPS: 50000.0000 [IU] | ORAL_CAPSULE | ORAL | Status: DC

## 2010-10-16 NOTE — Telephone Encounter (Signed)
Please call pt and let her know that her vitamin D is still low.  I would like her to stop the OTC vitamin D supplement and start a once weekly rx vitamin D as ordered.  Needs f/u vitamin D level in 12 weeks.

## 2010-10-17 NOTE — Telephone Encounter (Signed)
Left message on machine to return my call. 

## 2010-10-18 NOTE — Telephone Encounter (Signed)
Pt notified. She will return for additional Vit d blood work in July. Lab order has been entered and delivered to the lab.

## 2010-11-18 ENCOUNTER — Ambulatory Visit (INDEPENDENT_AMBULATORY_CARE_PROVIDER_SITE_OTHER): Payer: Federal, State, Local not specified - PPO | Admitting: Family

## 2010-11-18 ENCOUNTER — Encounter: Payer: Self-pay | Admitting: Family

## 2010-11-18 DIAGNOSIS — E559 Vitamin D deficiency, unspecified: Secondary | ICD-10-CM

## 2010-11-18 DIAGNOSIS — I10 Essential (primary) hypertension: Secondary | ICD-10-CM

## 2010-11-18 NOTE — Assessment & Plan Note (Signed)
Improved, continue Tribenzor.

## 2010-11-18 NOTE — Assessment & Plan Note (Signed)
Pt will return to the lab after 12 weeks of weekly supplementation for f/u vitamin D level.

## 2010-11-18 NOTE — Progress Notes (Signed)
  Subjective:    Patient ID: Brandy Stevens, female    DOB: [birth year], 75 y.o.   MRN: [id number]  HPI   Patient presents today for followup of hypertension.  Patient has been treated for Chronic HTN for quiet sometime. She is currently on Tribenzor, and well controlled. No associated S/S related to HTN.   Quality: chronic Modifying factor: meds Duration: Quite sometime Associated S/S: None.  The patient denies the following associated symptoms: Chest pain, dyspnea, blurred vision, headache, or lower extremity edema.    Review of Systems See HPI  Past Medical History  Diagnosis Date  . History of chicken pox   . UTI (lower urinary tract infection)   . Allergy     allergic rhinitis  . Anemia     nos  . Hypertension     History   Social History  . Marital Status: Widowed    Spouse Name: N/A    Number of Children: 1  . Years of Education: N/A   Occupational History  . NURSE    Social History Main Topics  . Smoking status: Never Smoker   . Smokeless tobacco: Never Used  . Alcohol Use: No  . Drug Use: No  . Sexually Active: Not on file   Other Topics Concern  . Not on file   Social History Narrative   7 brothers-- 1 died from unknown cause, 1 died from pneumonia. 5 still living.1 son a & w    Past Surgical History  Procedure Date  . Right oophorectomy 1968    No family history on file.  No Known Allergies  Current Outpatient Prescriptions on File Prior to Visit  Medication Sig Dispense Refill  . docusate sodium (COLACE) 100 MG capsule Take 1 capsule by mouth 1 to 2 times daily as needed.      . ergocalciferol (VITAMIN D2) 50000 UNITS capsule Take 1 capsule (50,000 Units total) by mouth once a week.  4 capsule  2  . ferrous sulfate (FERROUSUL) 325 (65 FE) MG tablet Take 325 mg by mouth 3 (three) times daily.        . Multiple Vitamins-Calcium (DAILY COMBO MULTIVITS/CALCIUM) TABS Take 1 tablet by mouth daily.        . Olmesartan-Amlodipine-HCTZ  (TRIBENZOR) 20-5-12.5 MG TABS Take 1 tablet by mouth daily.  30 tablet  3  . Omega-3 Fatty Acids (FISH OIL) 1000 MG CAPS Take 1 capsule by mouth daily. Currently takes it once a week in the summer.        BP 138/76  Pulse 78  Temp(Src) 98 F (36.7 C) (Oral)  Resp 16  Ht 5\' 4"  (1.626 m)  Wt 165 lb 0.6 oz (74.862 kg)  BMI 28.33 kg/m2       Objective:   Physical Exam    Gen: awake, alert, pleasant AA female in NAD CV: s1/s2, RRR, + systolic murmur grade 2-3/6 Resp: Faint crackles at bases, but improved since last visit. Ext: 1+ bilateral LE edema is noted.     Assessment & Plan:

## 2010-11-18 NOTE — Patient Instructions (Signed)
Please go to the lab around July 15th to complete your vitamin D check. Follow up in 3 months for an appointment.

## 2011-01-09 ENCOUNTER — Telehealth: Payer: Self-pay | Admitting: Family

## 2011-01-09 MED ORDER — VITAMIN D 1000 UNITS PO TABS: ORAL_TABLET | ORAL | Status: DC

## 2011-01-09 NOTE — Telephone Encounter (Signed)
Please call patient and let her know that her vitamin D level looks good.  I would like for her to switch from the weekly rx dose vitamin D to over the counter 3000 units once daily please.

## 2011-01-09 NOTE — Telephone Encounter (Signed)
Pt notified. States her current OTC vit D is 1000 units and she will take 3 a day.

## 2011-02-17 ENCOUNTER — Encounter: Payer: Self-pay | Admitting: Family

## 2011-02-17 ENCOUNTER — Ambulatory Visit (INDEPENDENT_AMBULATORY_CARE_PROVIDER_SITE_OTHER): Payer: Federal, State, Local not specified - PPO | Admitting: Family

## 2011-02-17 DIAGNOSIS — I10 Essential (primary) hypertension: Secondary | ICD-10-CM

## 2011-02-17 DIAGNOSIS — D509 Iron deficiency anemia, unspecified: Secondary | ICD-10-CM

## 2011-02-17 DIAGNOSIS — D649 Anemia, unspecified: Secondary | ICD-10-CM

## 2011-02-17 DIAGNOSIS — E559 Vitamin D deficiency, unspecified: Secondary | ICD-10-CM

## 2011-02-17 LAB — CBC WITH DIFFERENTIAL/PLATELET
Basophils Relative: 1 % (ref 0–1)
Hemoglobin: 11.9 g/dL — ABNORMAL LOW (ref 12.0–15.0)
Lymphocytes Relative: 33 % (ref 12–46)
Lymphs Abs: 1.6 10*3/uL (ref 0.7–4.0)
Monocytes Relative: 6 % (ref 3–12)
Neutro Abs: 2.7 10*3/uL (ref 1.7–7.7)
Neutrophils Relative %: 55 % (ref 43–77)
RBC: 4.22 MIL/uL (ref 3.87–5.11)
WBC: 5 10*3/uL (ref 4.0–10.5)

## 2011-02-17 LAB — IRON AND TIBC
%SAT: 14 % — ABNORMAL LOW (ref 20–55)
TIBC: 322 ug/dL (ref 250–470)

## 2011-02-17 LAB — BASIC METABOLIC PANEL
CO2: 31 mEq/L (ref 19–32)
Glucose, Bld: 84 mg/dL (ref 70–99)
Potassium: 3.9 mEq/L (ref 3.5–5.3)
Sodium: 141 mEq/L (ref 135–145)

## 2011-02-17 MED ORDER — OLMESARTAN-AMLODIPINE-HCTZ 20-5-12.5 MG PO TABS: 1.0000 | ORAL_TABLET | Freq: Every day | ORAL | Status: DC

## 2011-02-17 NOTE — Assessment & Plan Note (Signed)
Repeat CBC and iron level today, hopefully this will be improved.

## 2011-02-17 NOTE — Assessment & Plan Note (Signed)
BP Readings from Last 3 Encounters:  02/17/11 140/80  11/18/10 138/76  10/14/10 154/94  BP control reasonable. Continue current dose of tribenzor.

## 2011-02-17 NOTE — Assessment & Plan Note (Signed)
Start vitamin D 3000 units once daily.

## 2011-02-17 NOTE — Progress Notes (Signed)
  Subjective:    Patient ID: Brandy Stevens, female    DOB: Sep 22, 1935, 75 y.o.   MRN: [id number]  HPI  Brandy Stevens is a 75 yr old female who presents today for follow up.  1) Vitamin D deficiency- She reports that she has been only taking 3000 units once weekly.    2) Anemia- Denies bloody stools, taking iron.  Uses stool softner occasionally due to constipation from the iron.   3) Patient presents today for followup of hypertension.  Patient has been treated for Chronic HTN for quiet sometime. She is currently on trbenzor, and well controlled. No associated S/S related to HTN.   Quality: chronic Modifying factor: meds Duration: Quite sometime Associated S/S: None.  The patient denies the following associated symptoms: Chest pain, dyspnea, blurred vision, headache, or lower extremity edema.   Review of Systems see HPI    Past Medical History  Diagnosis Date  . History of chicken pox   . UTI (lower urinary tract infection)   . Allergy     allergic rhinitis  . Anemia     nos  . Hypertension   . Systolic murmur     Normal valves on 2-d echo 5/11    History   Social History  . Marital Status: Widowed    Spouse Name: N/A    Number of Children: 1  . Years of Education: N/A   Occupational History  . NURSE    Social History Main Topics  . Smoking status: Never Smoker   . Smokeless tobacco: Never Used  . Alcohol Use: No  . Drug Use: No  . Sexually Active: Not on file   Other Topics Concern  . Not on file   Social History Narrative   7 brothers-- 1 died from unknown cause, 1 died from pneumonia. 5 still living.1 son a & w    Past Surgical History  Procedure Date  . Right oophorectomy 1968    No family history on file.  No Known Allergies  Current Outpatient Prescriptions on File Prior to Visit  Medication Sig Dispense Refill  . cholecalciferol (VITAMIN D) 1000 UNITS tablet 3 tablets by mouth once daily  30 tablet  0  . docusate sodium (COLACE) 100 MG  capsule Take 1 capsule by mouth 1 to 2 times daily as needed.      . ferrous sulfate (FERROUSUL) 325 (65 FE) MG tablet Take 325 mg by mouth 3 (three) times daily.        . Multiple Vitamins-Calcium (DAILY COMBO MULTIVITS/CALCIUM) TABS Take 1 tablet by mouth daily.        . Omega-3 Fatty Acids (FISH OIL) 1000 MG CAPS Take 1 capsule by mouth daily. Currently takes it once a week in the summer.        BP 140/80  Pulse 68  Temp(Src) 97.9 F (36.6 C) (Oral)  Resp 16  Ht 5\' 4"  (1.626 m)  Wt 162 lb 1.3 oz (73.519 kg)  BMI 27.82 kg/m2    Objective:   Physical Exam  Constitutional: She appears well-developed and well-nourished.  HENT:  Head: Normocephalic and atraumatic.  Cardiovascular: Normal rate and regular rhythm.   Pulmonary/Chest: Effort normal and breath sounds normal.  Musculoskeletal:       Trace bilateral LE edema.           Assessment & Plan:  Lot [id number] exp 06/13

## 2011-02-17 NOTE — Patient Instructions (Signed)
Please complete your blood work on the first floor.  Follow up in 3 months.  

## 2011-02-21 ENCOUNTER — Encounter: Payer: Self-pay | Admitting: Family

## 2011-05-15 ENCOUNTER — Ambulatory Visit: Payer: Federal, State, Local not specified - PPO | Admitting: Internal Medicine

## 2011-05-22 ENCOUNTER — Encounter: Payer: Self-pay | Admitting: Family

## 2011-05-22 ENCOUNTER — Ambulatory Visit (INDEPENDENT_AMBULATORY_CARE_PROVIDER_SITE_OTHER): Payer: Federal, State, Local not specified - PPO | Admitting: Family

## 2011-05-22 DIAGNOSIS — I10 Essential (primary) hypertension: Secondary | ICD-10-CM

## 2011-05-22 DIAGNOSIS — E559 Vitamin D deficiency, unspecified: Secondary | ICD-10-CM

## 2011-05-22 MED ORDER — AMLODIPINE BESYLATE 10 MG PO TABS: 10.0000 mg | ORAL_TABLET | Freq: Every day | ORAL | Status: DC

## 2011-05-22 MED ORDER — HYDROCHLOROTHIAZIDE 12.5 MG PO CAPS: 12.5000 mg | ORAL_CAPSULE | Freq: Every day | ORAL | Status: DC

## 2011-05-22 MED ORDER — LISINOPRIL 10 MG PO TABS: 10.0000 mg | ORAL_TABLET | Freq: Every day | ORAL | Status: DC

## 2011-05-22 NOTE — Assessment & Plan Note (Addendum)
Deteriorated.  She is adamant that she wants to switch to something other than tribenzor.  She has tolerated lisinopril and HCTZ in the past.  Will D/C tribenzor, start lisinopril/HCTZ and amlodipine 10mg  separately.  Follow up in 2 weeks. Check BMET at her follow up visit.

## 2011-05-22 NOTE — Progress Notes (Signed)
rx called into pharmacy

## 2011-05-22 NOTE — Patient Instructions (Signed)
Please follow up in 2 weeks

## 2011-05-22 NOTE — Progress Notes (Signed)
Subjective:    Patient ID: Brandy Stevens, female    DOB: 01-17-1936, 75 y.o.   MRN: [id number]  HPI  Brandy Stevens is a 75 yr old female who presents today for follow up of her blood pressure.  Patient has been treated for Chronic HTN for quiet sometime. She is currently on tribenzor, and uncontrolled. No associated S/S related to HTN.  She tells me that she has been checking her blood pressure twice a week and that it has been 120-140 systolic since her last visit.   Quality: chronic Modifying factor: meds Duration: Quite sometime Associated S/S: None.  The patient denies the following associated symptoms: Chest pain, dyspnea, blurred vision, headache, or lower extremity edema.  She took her tribenzor this AM.  She reports that she felt better off of the tribenzor.  When she takes the tribenzor it makes her feel "like doing nothing."  She wants to try something different.    Review of Systems See HPI  Past Medical History  Diagnosis Date  . History of chicken pox   . UTI (lower urinary tract infection)   . Allergy     allergic rhinitis  . Anemia     nos  . Hypertension   . Systolic murmur     Normal valves on 2-d echo 5/11    History   Social History  . Marital Status: Widowed    Spouse Name: N/A    Number of Children: 1  . Years of Education: N/A   Occupational History  . NURSE    Social History Main Topics  . Smoking status: Never Smoker   . Smokeless tobacco: Never Used  . Alcohol Use: No  . Drug Use: No  . Sexually Active: Not on file   Other Topics Concern  . Not on file   Social History Narrative   7 brothers-- 1 died from unknown cause, 1 died from pneumonia. 5 still living.1 son a & w    Past Surgical History  Procedure Date  . Right oophorectomy 1968    No family history on file.  No Known Allergies  Current Outpatient Prescriptions on File Prior to Visit  Medication Sig Dispense Refill  . aspirin EC 81 MG tablet Take 81 mg by mouth daily.         . cholecalciferol (VITAMIN D) 1000 UNITS tablet 3 tablets by mouth once daily  30 tablet  0  . docusate sodium (COLACE) 100 MG capsule Take 1 capsule by mouth 1 to 2 times daily as needed.      . ferrous sulfate (FERROUSUL) 325 (65 FE) MG tablet Take 325 mg by mouth 3 (three) times daily.        . Omega-3 Fatty Acids (FISH OIL) 1000 MG CAPS Take 1 capsule by mouth daily. Currently takes it once a week in the summer.      . Multiple Vitamins-Calcium (DAILY COMBO MULTIVITS/CALCIUM) TABS Take 1 tablet by mouth daily.          BP 170/90  Pulse 84  Temp(Src) 98.3 F (36.8 C) (Oral)  Resp 16  Ht 5\' 4"  (1.626 m)  Wt 162 lb 0.6 oz (73.501 kg)  BMI 27.81 kg/m2       Objective:   Physical Exam  Constitutional: She appears well-developed and well-nourished.  HENT:  Head: Normocephalic and atraumatic.  Neck: Normal range of motion. Neck supple.  Cardiovascular: Normal rate and regular rhythm.   No murmur heard. Pulmonary/Chest: Effort normal and  breath sounds normal. No respiratory distress. She has no wheezes. She has no rales. She exhibits no tenderness.  Musculoskeletal: She exhibits no edema.  Psychiatric: She has a normal mood and affect. Her behavior is normal. Judgment and thought content normal.          Assessment & Plan:

## 2011-06-05 ENCOUNTER — Ambulatory Visit (INDEPENDENT_AMBULATORY_CARE_PROVIDER_SITE_OTHER): Payer: Federal, State, Local not specified - PPO | Admitting: Family

## 2011-06-05 ENCOUNTER — Encounter: Payer: Self-pay | Admitting: Family

## 2011-06-05 VITALS — BP 136/74 | HR 72 | Temp 97.8°F | Resp 16 | Ht 64.0 in | Wt 158.0 lb

## 2011-06-05 DIAGNOSIS — I10 Essential (primary) hypertension: Secondary | ICD-10-CM

## 2011-06-05 DIAGNOSIS — J069 Acute upper respiratory infection, unspecified: Secondary | ICD-10-CM | POA: Insufficient documentation

## 2011-06-05 MED ORDER — AMLODIPINE BESYLATE 10 MG PO TABS: 10.0000 mg | ORAL_TABLET | Freq: Every day | ORAL | Status: DC

## 2011-06-05 MED ORDER — LISINOPRIL 10 MG PO TABS: 10.0000 mg | ORAL_TABLET | Freq: Every day | ORAL | Status: DC

## 2011-06-05 MED ORDER — HYDROCHLOROTHIAZIDE 12.5 MG PO CAPS: 12.5000 mg | ORAL_CAPSULE | Freq: Every day | ORAL | Status: DC

## 2011-06-05 NOTE — Assessment & Plan Note (Signed)
History and exam suggest that she is at the tail end of a viral illness. She will call me if her symptoms do not continue to improve.

## 2011-06-05 NOTE — Progress Notes (Signed)
Subjective:    Patient ID: Brandy Stevens, female    DOB: 1935/07/02, 75 y.o.   MRN: [id number]  HPI  [name] is a 75 yr old female who presents today for follow up.  Patient presents today for followup of hypertension. Patient has been treated for Chronic HTN for quiet sometime. She is currently on lisinopril , amlodipine and HCTZ, and well controlled. No associated S/S related to HTN.   Quality: chronic Modifying factor: meds Duration: Quite sometime Associated S/S: None.  2) URI- Pt reports nasal congestion for 7 days.  Had improved by Friday. She reports + cough which has been generally dry.  Denies fever.  Energy is fair.  Appetite is fair.      Review of Systems    see HPI  Past Medical History  Diagnosis Date  . History of chicken pox   . UTI (lower urinary tract infection)   . Allergy     allergic rhinitis  . Anemia     nos  . Hypertension   . Systolic murmur     Normal valves on 2-d echo 5/11    History   Social History  . Marital Status: Widowed    Spouse Name: N/A    Number of Children: 1  . Years of Education: N/A   Occupational History  . NURSE    Social History Main Topics  . Smoking status: Never Smoker   . Smokeless tobacco: Never Used  . Alcohol Use: No  . Drug Use: No  . Sexually Active: Not on file   Other Topics Concern  . Not on file   Social History Narrative   7 brothers-- 1 died from unknown cause, 1 died from pneumonia. 5 still living.1 son a & w    Past Surgical History  Procedure Date  . Right oophorectomy 1968    No family history on file.  No Known Allergies  Current Outpatient Prescriptions on File Prior to Visit  Medication Sig Dispense Refill  . amLODipine (NORVASC) 10 MG tablet Take 1 tablet (10 mg total) by mouth daily.  30 tablet  0  . aspirin EC 81 MG tablet Take 81 mg by mouth daily.        . cholecalciferol (VITAMIN D) 1000 UNITS tablet 3 tablets by mouth once daily  30 tablet  0  . docusate sodium  (COLACE) 100 MG capsule Take 1 capsule by mouth 1 to 2 times daily as needed.      . ferrous sulfate (FERROUSUL) 325 (65 FE) MG tablet Take 325 mg by mouth 3 (three) times daily.        . hydrochlorothiazide (MICROZIDE) 12.5 MG capsule Take 1 capsule (12.5 mg total) by mouth daily.  30 capsule  0  . lisinopril (PRINIVIL,ZESTRIL) 10 MG tablet Take 1 tablet (10 mg total) by mouth daily.  30 tablet  0  . Multiple Vitamins-Calcium (DAILY COMBO MULTIVITS/CALCIUM) TABS Take 1 tablet by mouth daily.        . Omega-3 Fatty Acids (FISH OIL) 1000 MG CAPS Take 1 capsule by mouth daily. Currently takes it once a week in the summer.        BP 136/74  Pulse 72  Temp(Src) 97.8 F (36.6 C) (Oral)  Resp 16  Ht 5\' 4"  (1.626 m)  Wt 158 lb 0.6 oz (71.686 kg)  BMI 27.13 kg/m2    Objective:   Physical Exam  Constitutional: She appears well-developed and well-nourished.  HENT:  Head: Normocephalic  and atraumatic.  Right Ear: Tympanic membrane and ear canal normal.  Left Ear: Tympanic membrane and ear canal normal.  Mouth/Throat: Oropharynx is clear and moist and mucous membranes are normal. No oropharyngeal exudate.  Cardiovascular: Normal rate and regular rhythm.   No murmur heard. Pulmonary/Chest: Effort normal and breath sounds normal. No respiratory distress. She has no wheezes. She has no rales. She exhibits no tenderness.  Musculoskeletal: She exhibits no edema.  Psychiatric: She has a normal mood and affect. Her behavior is normal. Judgment and thought content normal.          Assessment & Plan:

## 2011-06-05 NOTE — Assessment & Plan Note (Signed)
Improved with current regimen.  Obtain bmet.

## 2011-06-05 NOTE — Patient Instructions (Signed)
Please call if your cough does not continue to improve. Follow up in 3 months.

## 2011-06-06 ENCOUNTER — Encounter: Payer: Self-pay | Admitting: Family

## 2011-06-06 LAB — BASIC METABOLIC PANEL
BUN: 19 mg/dL (ref 6–23)
Calcium: 8.9 mg/dL (ref 8.4–10.5)
Creat: 0.88 mg/dL (ref 0.50–1.10)

## 2011-09-04 ENCOUNTER — Ambulatory Visit (INDEPENDENT_AMBULATORY_CARE_PROVIDER_SITE_OTHER): Payer: Federal, State, Local not specified - PPO | Admitting: Family

## 2011-09-04 ENCOUNTER — Encounter: Payer: Self-pay | Admitting: Family

## 2011-09-04 DIAGNOSIS — I10 Essential (primary) hypertension: Secondary | ICD-10-CM

## 2011-09-04 DIAGNOSIS — D649 Anemia, unspecified: Secondary | ICD-10-CM

## 2011-09-04 MED ORDER — LISINOPRIL-HYDROCHLOROTHIAZIDE 20-12.5 MG PO TABS: 1.0000 | ORAL_TABLET | Freq: Every day | ORAL | Status: DC

## 2011-09-04 NOTE — Assessment & Plan Note (Addendum)
Will increase lisinopril to 20mg  and switch to lisinopril hct pill. Plan to obtain bmet next visit.

## 2011-09-04 NOTE — Patient Instructions (Signed)
Please follow up in 1 month.  

## 2011-09-04 NOTE — Progress Notes (Signed)
  Subjective:    Patient ID: Brandy Stevens, female    DOB: [birth year], 76 y.o.   MRN: [id number]  HPI  Brandy Stevens is a 76 yr old female who presents today for follow up.   HTN- reports that her BP at home- sbp 140's over 60-70 diastolic. Denies cp, swelling or sob.    Anemia-  She continues iron supplement without difficulty.  Review of Systems See HPI  Past Medical History  Diagnosis Date  . History of chicken pox   . UTI (lower urinary tract infection)   . Allergy     allergic rhinitis  . Anemia     nos  . Hypertension   . Systolic murmur     Normal valves on 2-d echo 5/11    History   Social History  . Marital Status: Widowed    Spouse Name: N/A    Number of Children: 1  . Years of Education: N/A   Occupational History  . NURSE    Social History Main Topics  . Smoking status: Never Smoker   . Smokeless tobacco: Never Used  . Alcohol Use: No  . Drug Use: No  . Sexually Active: Not on file   Other Topics Concern  . Not on file   Social History Narrative   7 brothers-- 1 died from unknown cause, 1 died from pneumonia. 5 still living.1 son a & w    Past Surgical History  Procedure Date  . Right oophorectomy 1968    No family history on file.  No Known Allergies  Current Outpatient Prescriptions on File Prior to Visit  Medication Sig Dispense Refill  . amLODipine (NORVASC) 10 MG tablet Take 1 tablet (10 mg total) by mouth daily.  30 tablet  3  . aspirin EC 81 MG tablet Take 81 mg by mouth daily.        . cholecalciferol (VITAMIN D) 1000 UNITS tablet 3 tablets by mouth once daily  30 tablet  0  . docusate sodium (COLACE) 100 MG capsule Take 1 capsule by mouth 1 to 2 times daily as needed.      . ferrous sulfate (FERROUSUL) 325 (65 FE) MG tablet Take 325 mg by mouth 3 (three) times daily.        . Multiple Vitamins-Calcium (DAILY COMBO MULTIVITS/CALCIUM) TABS Take 1 tablet by mouth daily.        . Omega-3 Fatty Acids (FISH OIL) 1000 MG CAPS Take 1  capsule by mouth daily. Currently takes it once a week in the summer.        BP 148/60  Pulse 83  Temp(Src) 97.8 F (36.6 C) (Oral)  Resp 16  Ht 5\' 4"  (1.626 m)  Wt 158 lb 1.3 oz (71.705 kg)  BMI 27.13 kg/m2  SpO2 97%       Objective:   Physical Exam  Constitutional: She appears well-developed and well-nourished. No distress.  Cardiovascular: Normal rate and regular rhythm.   No murmur heard. Pulmonary/Chest: Effort normal. No respiratory distress. She has no wheezes. She has no rales. She exhibits no tenderness.  Musculoskeletal: She exhibits no edema.  Psychiatric: She has a normal mood and affect. Her behavior is normal. Judgment and thought content normal.          Assessment & Plan:   BP Readings from Last 3 Encounters:  09/04/11 148/60  06/05/11 136/74  05/22/11 170/90

## 2011-09-04 NOTE — Assessment & Plan Note (Signed)
Will plan to continue iron supplement and check iron level and cbc next visit.

## 2011-10-06 ENCOUNTER — Ambulatory Visit (INDEPENDENT_AMBULATORY_CARE_PROVIDER_SITE_OTHER): Payer: Federal, State, Local not specified - PPO | Admitting: Family

## 2011-10-06 ENCOUNTER — Encounter: Payer: Self-pay | Admitting: Family

## 2011-10-06 VITALS — BP 130/78 | HR 76 | Temp 97.8°F | Resp 16 | Ht 64.0 in | Wt 158.1 lb

## 2011-10-06 DIAGNOSIS — D509 Iron deficiency anemia, unspecified: Secondary | ICD-10-CM

## 2011-10-06 DIAGNOSIS — D649 Anemia, unspecified: Secondary | ICD-10-CM

## 2011-10-06 DIAGNOSIS — I10 Essential (primary) hypertension: Secondary | ICD-10-CM

## 2011-10-06 LAB — CBC WITH DIFFERENTIAL/PLATELET
Eosinophils Absolute: 0.2 10*3/uL (ref 0.0–0.7)
Hemoglobin: 11.2 g/dL — ABNORMAL LOW (ref 12.0–15.0)
Lymphocytes Relative: 37 % (ref 12–46)
Lymphs Abs: 1.8 10*3/uL (ref 0.7–4.0)
MCH: 27.9 pg (ref 26.0–34.0)
MCV: 88.8 fL (ref 78.0–100.0)
Monocytes Relative: 7 % (ref 3–12)
Neutrophils Relative %: 52 % (ref 43–77)
RBC: 4.02 MIL/uL (ref 3.87–5.11)
WBC: 5 10*3/uL (ref 4.0–10.5)

## 2011-10-06 LAB — BASIC METABOLIC PANEL
Chloride: 104 mEq/L (ref 96–112)
Creat: 0.89 mg/dL (ref 0.50–1.10)
Potassium: 4.2 mEq/L (ref 3.5–5.3)

## 2011-10-06 NOTE — Assessment & Plan Note (Signed)
BP Readings from Last 3 Encounters:  10/06/11 130/78  09/04/11 148/60  06/05/11 136/74   Improved on current dose, obtain bmet

## 2011-10-06 NOTE — Assessment & Plan Note (Signed)
Check cbc, iron level.  Decrease iron to bid due to constipation. Recommended colace prn.

## 2011-10-06 NOTE — Progress Notes (Signed)
Subjective:    Patient ID: Brandy Stevens, female    DOB: [birth year], 76 y.o.   MRN: [id number]  HPI  [name] is a 76 yr old female who presents today for follow up.  1) HTN- last visit her lisinopril was increased from 10mg  to 20mg  in her zestoretic. She denies significant cough.    2) Anemia- she reports that she has had some constipation with the iron. No current constipation  Review of Systems    see HPI  Past Medical History  Diagnosis Date  . History of chicken pox   . UTI (lower urinary tract infection)   . Allergy     allergic rhinitis  . Anemia     nos  . Hypertension   . Systolic murmur     Normal valves on 2-d echo 5/11    History   Social History  . Marital Status: Widowed    Spouse Name: N/A    Number of Children: 1  . Years of Education: N/A   Occupational History  . NURSE    Social History Main Topics  . Smoking status: Never Smoker   . Smokeless tobacco: Never Used  . Alcohol Use: No  . Drug Use: No  . Sexually Active: Not on file   Other Topics Concern  . Not on file   Social History Narrative   7 brothers-- 1 died from unknown cause, 1 died from pneumonia. 5 still living.1 son a & w    Past Surgical History  Procedure Date  . Right oophorectomy 1968    No family history on file.  No Known Allergies  Current Outpatient Prescriptions on File Prior to Visit  Medication Sig Dispense Refill  . amLODipine (NORVASC) 10 MG tablet Take 1 tablet (10 mg total) by mouth daily.  30 tablet  3  . aspirin EC 81 MG tablet Take 81 mg by mouth daily.        . Calcium Carbonate-Vitamin D (CALTRATE 600+D) 600-400 MG-UNIT per tablet Take 1 tablet by mouth 2 (two) times daily.      Marland Kitchen docusate sodium (COLACE) 100 MG capsule Take 1 capsule by mouth 1 to 2 times daily as needed.      . ferrous sulfate (FERROUSUL) 325 (65 FE) MG tablet Take 325 mg by mouth 2 (two) times daily.       Marland Kitchen lisinopril-hydrochlorothiazide (ZESTORETIC) 20-12.5 MG per tablet Take  1 tablet by mouth daily.  30 tablet  2  . Omega-3 Fatty Acids (FISH OIL) 1000 MG CAPS Take 1 capsule by mouth daily. Currently takes it once a week in the summer.      Marland Kitchen DISCONTD: hydrochlorothiazide (MICROZIDE) 12.5 MG capsule Take 1 capsule (12.5 mg total) by mouth daily.  30 capsule  3  . DISCONTD: lisinopril (PRINIVIL,ZESTRIL) 10 MG tablet Take 1 tablet (10 mg total) by mouth daily.  30 tablet  3    BP 130/78  Pulse 76  Temp(Src) 97.8 F (36.6 C) (Oral)  Resp 16  Ht 5\' 4"  (1.626 m)  Wt 158 lb 1.3 oz (71.705 kg)  BMI 27.13 kg/m2  SpO2 99%    Objective:   Physical Exam  Constitutional: She appears well-developed and well-nourished. No distress.  Cardiovascular: Normal rate and regular rhythm.   Murmur heard. Pulmonary/Chest: Effort normal and breath sounds normal. No respiratory distress. She has no wheezes. She has no rales. She exhibits no tenderness.  Psychiatric: She has a normal mood and affect. Her behavior  is normal. Judgment and thought content normal.          Assessment & Plan:

## 2011-10-06 NOTE — Patient Instructions (Signed)
Please complete your blood work prior to leaving.  Follow up in 3 months. 

## 2011-10-07 LAB — IRON: Iron: 50 ug/dL (ref 42–145)

## 2011-10-09 ENCOUNTER — Encounter: Payer: Self-pay | Admitting: Family

## 2011-11-15 ENCOUNTER — Telehealth: Payer: Self-pay | Admitting: Family

## 2011-11-15 MED ORDER — AMLODIPINE BESYLATE 10 MG PO TABS: 10.0000 mg | ORAL_TABLET | Freq: Every day | ORAL | Status: DC

## 2011-11-15 NOTE — Telephone Encounter (Signed)
Refill- amlodipine besylate 10mg  oral tablet. Take one tablet by mouth one time daily. Qty 30 last fill 4.17.13

## 2011-12-13 ENCOUNTER — Telehealth: Payer: Self-pay | Admitting: Family

## 2011-12-13 MED ORDER — LISINOPRIL-HYDROCHLOROTHIAZIDE 20-12.5 MG PO TABS: 1.0000 | ORAL_TABLET | Freq: Every day | ORAL | Status: DC

## 2011-12-13 NOTE — Telephone Encounter (Signed)
Refill- zestoretic 20-12.5mg  oral tablet. Take one tablet by mouth daily. Qty 30 last fill 5.22.13

## 2011-12-13 NOTE — Telephone Encounter (Signed)
Refill sent to pharmacy #[id number] x 2 refills. 

## 2012-01-05 ENCOUNTER — Ambulatory Visit: Payer: Federal, State, Local not specified - PPO | Admitting: Family

## 2012-01-30 ENCOUNTER — Ambulatory Visit (INDEPENDENT_AMBULATORY_CARE_PROVIDER_SITE_OTHER): Payer: Federal, State, Local not specified - PPO | Admitting: Family

## 2012-01-30 ENCOUNTER — Encounter: Payer: Self-pay | Admitting: Family

## 2012-01-30 VITALS — BP 126/80 | HR 80 | Temp 98.1°F | Resp 14 | Wt 159.1 lb

## 2012-01-30 DIAGNOSIS — I10 Essential (primary) hypertension: Secondary | ICD-10-CM

## 2012-01-30 NOTE — Progress Notes (Signed)
Subjective:    Patient ID: Brandy Stevens, female    DOB: October 15, 1935, 76 y.o.   MRN: [id number]  HPI  HTN- denies cp/swelling sob.  Feels well.  Reports + compliance with meds.  BP Readings from Last 3 Encounters:  01/30/12 126/80  10/06/11 130/78  09/04/11 148/60       Review of Systems See HPI  Past Medical History  Diagnosis Date  . History of chicken pox   . UTI (lower urinary tract infection)   . Allergy     allergic rhinitis  . Anemia     nos  . Hypertension   . Systolic murmur     Normal valves on 2-d echo 5/11    History   Social History  . Marital Status: Widowed    Spouse Name: N/A    Number of Children: 1  . Years of Education: N/A   Occupational History  . NURSE    Social History Main Topics  . Smoking status: Never Smoker   . Smokeless tobacco: Never Used  . Alcohol Use: No  . Drug Use: No  . Sexually Active: Not on file   Other Topics Concern  . Not on file   Social History Narrative   7 brothers-- 1 died from unknown cause, 1 died from pneumonia. 5 still living.1 son a & w    Past Surgical History  Procedure Date  . Right oophorectomy 1968    No family history on file.  No Known Allergies  Current Outpatient Prescriptions on File Prior to Visit  Medication Sig Dispense Refill  . amLODipine (NORVASC) 10 MG tablet Take 1 tablet (10 mg total) by mouth daily.  30 tablet  2  . aspirin EC 81 MG tablet Take 81 mg by mouth daily.        . Calcium Carbonate-Vitamin D (CALTRATE 600+D) 600-400 MG-UNIT per tablet Take 1 tablet by mouth 2 (two) times daily.      . cholecalciferol (VITAMIN D) 1000 UNITS tablet Take 2,000 Units by mouth daily.      Marland Kitchen docusate sodium (COLACE) 100 MG capsule Take 1 capsule by mouth 1 to 2 times daily as needed.      . ferrous sulfate (FERROUSUL) 325 (65 FE) MG tablet Take 325 mg by mouth 2 (two) times daily.       Marland Kitchen lisinopril-hydrochlorothiazide (ZESTORETIC) 20-12.5 MG per tablet Take 1 tablet by mouth daily.   30 tablet  2  . Omega-3 Fatty Acids (FISH OIL) 1000 MG CAPS Take 1 capsule by mouth daily. Currently takes it once a week in the summer.      Marland Kitchen DISCONTD: hydrochlorothiazide (MICROZIDE) 12.5 MG capsule Take 1 capsule (12.5 mg total) by mouth daily.  30 capsule  3  . DISCONTD: lisinopril (PRINIVIL,ZESTRIL) 10 MG tablet Take 1 tablet (10 mg total) by mouth daily.  30 tablet  3    BP 126/80  Pulse 80  Temp 98.1 F (36.7 C) (Oral)  Resp 14  Wt 159 lb 1.3 oz (72.158 kg)  SpO2 99%       Objective:   Physical Exam  Constitutional: She appears well-developed and well-nourished. No distress.  Cardiovascular: Normal rate and regular rhythm.   No murmur heard. Pulmonary/Chest: Effort normal and breath sounds normal. No respiratory distress. She has no wheezes. She has no rales. She exhibits no tenderness.  Musculoskeletal: She exhibits no edema.  Psychiatric: She has a normal mood and affect. Her behavior is normal. Judgment and thought  content normal.          Assessment & Plan:

## 2012-01-30 NOTE — Patient Instructions (Addendum)
Please follow up in 4 months

## 2012-01-30 NOTE — Assessment & Plan Note (Signed)
Stable. Continue current meds.   

## 2012-04-03 ENCOUNTER — Telehealth: Payer: Self-pay | Admitting: Family

## 2012-04-03 MED ORDER — LISINOPRIL-HYDROCHLOROTHIAZIDE 20-12.5 MG PO TABS: 1.0000 | ORAL_TABLET | Freq: Every day | ORAL | Status: DC

## 2012-04-03 NOTE — Telephone Encounter (Signed)
Refill-lisinopril hydrochlorothiazide 20-1. Take one tablet by mouth daily. Qty 30 last fill 9.4.13

## 2012-04-03 NOTE — Telephone Encounter (Signed)
Refill sent to [facility]

## 2012-04-05 ENCOUNTER — Telehealth: Payer: Self-pay | Admitting: Family

## 2012-04-05 MED ORDER — AMLODIPINE BESYLATE 10 MG PO TABS: 10.0000 mg | ORAL_TABLET | Freq: Every day | ORAL | Status: DC

## 2012-04-05 NOTE — Telephone Encounter (Signed)
Pt walked in to the office stating that she needs a refill of amlodipine sent to [facility] on [address] in [place]. Patient is requesting enough refills to last her until her appointment in December  Best # [phone] to reach patient at.

## 2012-04-05 NOTE — Telephone Encounter (Signed)
Refills sent to pharmacy, detailed message left on home# and to call if any questions.

## 2012-05-31 ENCOUNTER — Encounter: Payer: Self-pay | Admitting: Family

## 2012-05-31 ENCOUNTER — Ambulatory Visit (INDEPENDENT_AMBULATORY_CARE_PROVIDER_SITE_OTHER): Payer: Federal, State, Local not specified - PPO | Admitting: Family

## 2012-05-31 VITALS — BP 124/76 | HR 90 | Temp 97.7°F | Resp 16 | Ht 64.0 in | Wt 160.1 lb

## 2012-05-31 DIAGNOSIS — D649 Anemia, unspecified: Secondary | ICD-10-CM

## 2012-05-31 DIAGNOSIS — I10 Essential (primary) hypertension: Secondary | ICD-10-CM

## 2012-05-31 LAB — BASIC METABOLIC PANEL
CO2: 30 mEq/L (ref 19–32)
Chloride: 103 mEq/L (ref 96–112)
Glucose, Bld: 87 mg/dL (ref 70–99)
Potassium: 3.9 mEq/L (ref 3.5–5.3)
Sodium: 141 mEq/L (ref 135–145)

## 2012-05-31 MED ORDER — LISINOPRIL-HYDROCHLOROTHIAZIDE 20-12.5 MG PO TABS: 1.0000 | ORAL_TABLET | Freq: Every day | ORAL | Status: DC

## 2012-05-31 MED ORDER — AMLODIPINE BESYLATE 10 MG PO TABS: 10.0000 mg | ORAL_TABLET | Freq: Every day | ORAL | Status: DC

## 2012-05-31 NOTE — Assessment & Plan Note (Signed)
BP stable on zestoretic. Continue same.

## 2012-05-31 NOTE — Patient Instructions (Addendum)
Complete your blood work prior to leaving. Resume iron supplement. Please schedule a follow up appointment in 6 months.

## 2012-05-31 NOTE — Assessment & Plan Note (Signed)
Instructed her to resume iron supplement once daily.

## 2012-05-31 NOTE — Progress Notes (Signed)
Subjective:    Patient ID: Brandy Stevens, female    DOB: [birth year], 76 y.o.   MRN: [id number]  HPI   HTN- patient denies CP/SOB or swelling.  She continues zestoretic.  She recently retired and is enjoying her time.   Anemia- she is not taking iron due to constipation.  Plans to resume.     Review of Systems See HPI  Past Medical History  Diagnosis Date  . History of chicken pox   . UTI (lower urinary tract infection)   . Allergy     allergic rhinitis  . Anemia     nos  . Hypertension   . Systolic murmur     Normal valves on 2-d echo 5/11    History   Social History  . Marital Status: Widowed    Spouse Name: N/A    Number of Children: 1  . Years of Education: N/A   Occupational History  . NURSE    Social History Main Topics  . Smoking status: Never Smoker   . Smokeless tobacco: Never Used  . Alcohol Use: No  . Drug Use: No  . Sexually Active: Not on file   Other Topics Concern  . Not on file   Social History Narrative   7 brothers-- 1 died from unknown cause, 1 died from pneumonia. 5 still living.1 son a & w    Past Surgical History  Procedure Date  . Right oophorectomy 1968    No family history on file.  No Known Allergies  Current Outpatient Prescriptions on File Prior to Visit  Medication Sig Dispense Refill  . aspirin EC 81 MG tablet Take 81 mg by mouth daily.        . Calcium Carbonate-Vitamin D (CALTRATE 600+D) 600-400 MG-UNIT per tablet Take 1 tablet by mouth 2 (two) times daily.      . cholecalciferol (VITAMIN D) 1000 UNITS tablet Take 2,000 Units by mouth daily.      Marland Kitchen docusate sodium (COLACE) 100 MG capsule Take 1 capsule by mouth 1 to 2 times daily as needed.      . ferrous sulfate (FERROUSUL) 325 (65 FE) MG tablet Take 325 mg by mouth 2 (two) times daily.       . Omega-3 Fatty Acids (FISH OIL) 1000 MG CAPS Take 1 capsule by mouth daily. Currently takes it once a week in the summer.      . [DISCONTINUED] amLODipine (NORVASC) 10 MG  tablet Take 1 tablet (10 mg total) by mouth daily.  30 tablet  2  . [DISCONTINUED] lisinopril-hydrochlorothiazide (ZESTORETIC) 20-12.5 MG per tablet Take 1 tablet by mouth daily.  30 tablet  2  . [DISCONTINUED] hydrochlorothiazide (MICROZIDE) 12.5 MG capsule Take 1 capsule (12.5 mg total) by mouth daily.  30 capsule  3  . [DISCONTINUED] lisinopril (PRINIVIL,ZESTRIL) 10 MG tablet Take 1 tablet (10 mg total) by mouth daily.  30 tablet  3    BP 124/76  Pulse 90  Temp 97.7 F (36.5 C) (Oral)  Resp 16  Ht 5\' 4"  (1.626 m)  Wt 160 lb 1.9 oz (72.63 kg)  BMI 27.48 kg/m2  SpO2 99%       Objective:   Physical Exam  Constitutional: She is oriented to person, place, and time. She appears well-developed and well-nourished. No distress.  Cardiovascular: Normal rate and regular rhythm.   No murmur heard. Pulmonary/Chest: Effort normal and breath sounds normal. No respiratory distress. She has no wheezes. She has no rales. She exhibits  no tenderness.  Musculoskeletal:       Trace bilateral LE edema  Neurological: She is alert and oriented to person, place, and time.  Skin: Skin is warm and dry.  Psychiatric: She has a normal mood and affect. Her behavior is normal. Judgment and thought content normal.          Assessment & Plan:

## 2012-06-03 ENCOUNTER — Encounter: Payer: Self-pay | Admitting: Family

## 2012-11-29 ENCOUNTER — Ambulatory Visit: Payer: Federal, State, Local not specified - PPO | Admitting: Family

## 2012-12-02 ENCOUNTER — Ambulatory Visit (INDEPENDENT_AMBULATORY_CARE_PROVIDER_SITE_OTHER): Payer: Federal, State, Local not specified - PPO | Admitting: Family

## 2012-12-02 ENCOUNTER — Encounter: Payer: Self-pay | Admitting: Family

## 2012-12-02 VITALS — BP 110/66 | HR 75 | Temp 98.2°F | Resp 16 | Ht 64.0 in | Wt 162.0 lb

## 2012-12-02 DIAGNOSIS — D649 Anemia, unspecified: Secondary | ICD-10-CM

## 2012-12-02 DIAGNOSIS — I10 Essential (primary) hypertension: Secondary | ICD-10-CM

## 2012-12-02 LAB — BASIC METABOLIC PANEL
BUN: 15 mg/dL (ref 6–23)
CO2: 29 mEq/L (ref 19–32)
Calcium: 9.1 mg/dL (ref 8.4–10.5)
Creat: 0.75 mg/dL (ref 0.50–1.10)
Glucose, Bld: 100 mg/dL — ABNORMAL HIGH (ref 70–99)

## 2012-12-02 MED ORDER — LOSARTAN POTASSIUM 25 MG PO TABS: 25.0000 mg | ORAL_TABLET | Freq: Every day | ORAL | Status: DC

## 2012-12-02 MED ORDER — HYDROCHLOROTHIAZIDE 12.5 MG PO CAPS: 12.5000 mg | ORAL_CAPSULE | Freq: Every day | ORAL | Status: DC

## 2012-12-02 MED ORDER — AMLODIPINE BESYLATE 10 MG PO TABS: 10.0000 mg | ORAL_TABLET | Freq: Every day | ORAL | Status: DC

## 2012-12-02 NOTE — Assessment & Plan Note (Signed)
BP is stable on current meds but intolerant to ace due to cough. Will d/c lisinopril, continue hctz 12.5mg  and place her on low dose losartan.  Obtain bmet today.  Plan follow up in 1 month for repeat BMET and BP check.

## 2012-12-02 NOTE — Patient Instructions (Addendum)
Please complete lab work prior to leaving. Follow up in 1 month.  

## 2012-12-02 NOTE — Assessment & Plan Note (Signed)
Continue iron, clinically stable.

## 2012-12-02 NOTE — Progress Notes (Signed)
Subjective:    Patient ID: Brandy Stevens, female    DOB: 1936-03-05, 77 y.o.   MRN: [id number]  HPI  HTN- denies CP or LE edema.  Reports cough. Finds this bothersome, especially when she is in church singing  Anemia- blood she continues iron supplement.   Review of Systems See HPI  Past Medical History  Diagnosis Date  . History of chicken pox   . UTI (lower urinary tract infection)   . Allergy     allergic rhinitis  . Anemia     nos  . Hypertension   . Systolic murmur     Normal valves on 2-d echo 5/11    History   Social History  . Marital Status: Widowed    Spouse Name: N/A    Number of Children: 1  . Years of Education: N/A   Occupational History  . NURSE    Social History Main Topics  . Smoking status: Never Smoker   . Smokeless tobacco: Never Used  . Alcohol Use: No  . Drug Use: No  . Sexually Active: Not on file   Other Topics Concern  . Not on file   Social History Narrative   7 brothers-- 1 died from unknown cause, 1 died from pneumonia. 5 still living.   1 son a & w          Past Surgical History  Procedure Laterality Date  . Right oophorectomy  1968    No family history on file.  No Known Allergies  Current Outpatient Prescriptions on File Prior to Visit  Medication Sig Dispense Refill  . aspirin EC 81 MG tablet Take 81 mg by mouth daily.        . Calcium Carbonate-Vitamin D (CALTRATE 600+D) 600-400 MG-UNIT per tablet Take 1 tablet by mouth 2 (two) times daily.      . cholecalciferol (VITAMIN D) 1000 UNITS tablet Take 2,000 Units by mouth daily.      Marland Kitchen docusate sodium (COLACE) 100 MG capsule Take 1 capsule by mouth 1 to 2 times daily as needed.      . ferrous sulfate (FERROUSUL) 325 (65 FE) MG tablet Take 325 mg by mouth daily with breakfast.       . Omega-3 Fatty Acids (FISH OIL) 1000 MG CAPS Take 1 capsule by mouth daily. Currently takes it once a week in the summer.      . [DISCONTINUED] lisinopril (PRINIVIL,ZESTRIL) 10 MG tablet  Take 1 tablet (10 mg total) by mouth daily.  30 tablet  3   No current facility-administered medications on file prior to visit.    BP 110/66  Pulse 75  Temp(Src) 98.2 F (36.8 C) (Oral)  Resp 16  Ht 5\' 4"  (1.626 m)  Wt 162 lb 0.6 oz (73.501 kg)  BMI 27.8 kg/m2  SpO2 97%       Objective:   Physical Exam  Constitutional: She is oriented to person, place, and time. She appears well-developed and well-nourished. No distress.  HENT:  Head: Normocephalic and atraumatic.  Cardiovascular: Normal rate and regular rhythm.   No murmur heard. Pulmonary/Chest: Effort normal and breath sounds normal. No respiratory distress. She has no wheezes. She has no rales. She exhibits no tenderness.  Musculoskeletal:  Trace bilateral LE edema  Neurological: She is alert and oriented to person, place, and time.  Skin: Skin is warm and dry.  Psychiatric: She has a normal mood and affect. Her behavior is normal. Judgment and thought content normal.  Assessment & Plan:

## 2012-12-04 ENCOUNTER — Encounter: Payer: Self-pay | Admitting: Family

## 2012-12-24 ENCOUNTER — Ambulatory Visit: Payer: Federal, State, Local not specified - PPO | Admitting: Family

## 2013-01-06 ENCOUNTER — Encounter: Payer: Self-pay | Admitting: Family

## 2013-01-06 ENCOUNTER — Ambulatory Visit (INDEPENDENT_AMBULATORY_CARE_PROVIDER_SITE_OTHER): Payer: Federal, State, Local not specified - PPO | Admitting: Family

## 2013-01-06 VITALS — BP 136/74 | HR 72 | Temp 97.8°F | Resp 16 | Ht 64.0 in | Wt 159.0 lb

## 2013-01-06 DIAGNOSIS — I10 Essential (primary) hypertension: Secondary | ICD-10-CM

## 2013-01-06 MED ORDER — HYDROCHLOROTHIAZIDE 12.5 MG PO CAPS: 12.5000 mg | ORAL_CAPSULE | Freq: Every day | ORAL | Status: DC

## 2013-01-06 MED ORDER — LOSARTAN POTASSIUM 25 MG PO TABS: 25.0000 mg | ORAL_TABLET | Freq: Every day | ORAL | Status: DC

## 2013-01-06 MED ORDER — AMLODIPINE BESYLATE 10 MG PO TABS: 10.0000 mg | ORAL_TABLET | Freq: Every day | ORAL | Status: DC

## 2013-01-06 NOTE — Patient Instructions (Addendum)
Please complete your lab work prior to leaving. Please schedule a follow up appointment in 3 months.  

## 2013-01-06 NOTE — Assessment & Plan Note (Signed)
ACE cough is resolving, tolerating losartan. Continue along with amlodipine and hctz.  Obtain bmet.

## 2013-01-06 NOTE — Progress Notes (Signed)
Subjective:    Patient ID: Brandy Stevens, female    DOB: 10-12-1935, 77 y.o.   MRN: [id number]  HPI  Brandy Stevens is a 77 yr old female who presents today for follow up of her blood pressure.  Last visit her ACE was discontinued due to cough. Reports that cough is improving since ACE was discontinued.  Notes mild rash on her left lower neck.    Review of Systems See HPI  Past Medical History  Diagnosis Date  . History of chicken pox   . UTI (lower urinary tract infection)   . Allergy     allergic rhinitis  . Anemia     nos  . Hypertension   . Systolic murmur     Normal valves on 2-d echo 5/11    History   Social History  . Marital Status: Widowed    Spouse Name: N/A    Number of Children: 1  . Years of Education: N/A   Occupational History  . NURSE    Social History Main Topics  . Smoking status: Never Smoker   . Smokeless tobacco: Never Used  . Alcohol Use: No  . Drug Use: No  . Sexually Active: Not on file   Other Topics Concern  . Not on file   Social History Narrative   7 brothers-- 1 died from unknown cause, 1 died from pneumonia. 5 still living.   1 son a & w          Past Surgical History  Procedure Laterality Date  . Right oophorectomy  1968    No family history on file.  Allergies  Allergen Reactions  . Ace Inhibitors     cough    Current Outpatient Prescriptions on File Prior to Visit  Medication Sig Dispense Refill  . amLODipine (NORVASC) 10 MG tablet Take 1 tablet (10 mg total) by mouth daily.  30 tablet  5  . aspirin EC 81 MG tablet Take 81 mg by mouth daily.        . Calcium Carbonate-Vitamin D (CALTRATE 600+D) 600-400 MG-UNIT per tablet Take 1 tablet by mouth 2 (two) times daily.      . cholecalciferol (VITAMIN D) 1000 UNITS tablet Take 2,000 Units by mouth daily.      Marland Kitchen docusate sodium (COLACE) 100 MG capsule Take 1 capsule by mouth 1 to 2 times daily as needed.      . ferrous sulfate (FERROUSUL) 325 (65 FE) MG tablet Take 325 mg  by mouth daily with breakfast.       . hydrochlorothiazide (MICROZIDE) 12.5 MG capsule Take 1 capsule (12.5 mg total) by mouth daily.  30 capsule  2  . losartan (COZAAR) 25 MG tablet Take 1 tablet (25 mg total) by mouth daily.  30 tablet  1  . Omega-3 Fatty Acids (FISH OIL) 1000 MG CAPS Take 1 capsule by mouth daily. Currently takes it once a week in the summer.      . [DISCONTINUED] lisinopril (PRINIVIL,ZESTRIL) 10 MG tablet Take 1 tablet (10 mg total) by mouth daily.  30 tablet  3   No current facility-administered medications on file prior to visit.    BP 136/74  Pulse 72  Temp(Src) 97.8 F (36.6 C) (Oral)  Resp 16  Ht 5\' 4"  (1.626 m)  Wt 159 lb 0.6 oz (72.14 kg)  BMI 27.29 kg/m2  SpO2 99%       Objective:   Physical Exam  Constitutional: She is oriented to person,  place, and time. She appears well-developed and well-nourished. No distress.  Cardiovascular: Normal rate and regular rhythm.   No murmur heard. Pulmonary/Chest: Effort normal and breath sounds normal. No respiratory distress. She has no wheezes. She has no rales. She exhibits no tenderness.  Musculoskeletal: She exhibits no edema.  Neurological: She is alert and oriented to person, place, and time.  Psychiatric: She has a normal mood and affect. Her behavior is normal. Judgment and thought content normal.  skin: mild eczematous changes note left lower neck.         Assessment & Plan:

## 2013-01-07 ENCOUNTER — Encounter: Payer: Self-pay | Admitting: Family

## 2013-01-07 LAB — BASIC METABOLIC PANEL
BUN: 19 mg/dL (ref 6–23)
Calcium: 9.2 mg/dL (ref 8.4–10.5)
Creat: 0.86 mg/dL (ref 0.50–1.10)
Glucose, Bld: 81 mg/dL (ref 70–99)
Sodium: 141 mEq/L (ref 135–145)

## 2013-04-09 ENCOUNTER — Encounter: Payer: Self-pay | Admitting: Family

## 2013-04-09 ENCOUNTER — Ambulatory Visit (INDEPENDENT_AMBULATORY_CARE_PROVIDER_SITE_OTHER): Payer: Federal, State, Local not specified - PPO | Admitting: Family

## 2013-04-09 VITALS — BP 148/80 | HR 78 | Temp 97.9°F | Resp 16 | Ht 64.0 in | Wt 162.1 lb

## 2013-04-09 DIAGNOSIS — Z23 Encounter for immunization: Secondary | ICD-10-CM

## 2013-04-09 DIAGNOSIS — I10 Essential (primary) hypertension: Secondary | ICD-10-CM

## 2013-04-09 MED ORDER — LOSARTAN POTASSIUM 25 MG PO TABS: 25.0000 mg | ORAL_TABLET | Freq: Every day | ORAL | Status: DC

## 2013-04-09 NOTE — Patient Instructions (Signed)
Increase losartan to 2 tabs (50mg  total) once daily. Follow up in 2 weeks.

## 2013-04-09 NOTE — Assessment & Plan Note (Signed)
BP above goal.  Plan to have pt increase losartan from 25mg  to 50mg . Follow up in 2 weeks for bp check and bmet.

## 2013-04-09 NOTE — Progress Notes (Signed)
Subjective:    Patient ID: Brandy Stevens, female    DOB: 02/21/1936, 77 y.o.   MRN: [id number]  HPI  Brandy Stevens is a 77 yr old female who presents today or follow up of hypertension. She is currently maintained on losartan and HCTZ.  Tolerating without problem. Denies CP, SOB. She does have some mild LE edema. She reports resolution of ace cough.   Review of Systems See HPI  Past Medical History  Diagnosis Date  . History of chicken pox   . UTI (lower urinary tract infection)   . Allergy     allergic rhinitis  . Anemia     nos  . Hypertension   . Systolic murmur     Normal valves on 2-d echo 5/11    History   Social History  . Marital Status: Widowed    Spouse Name: N/A    Number of Children: 1  . Years of Education: N/A   Occupational History  . NURSE    Social History Main Topics  . Smoking status: Never Smoker   . Smokeless tobacco: Never Used  . Alcohol Use: No  . Drug Use: No  . Sexual Activity: Not on file   Other Topics Concern  . Not on file   Social History Narrative   7 brothers-- 1 died from unknown cause, 1 died from pneumonia. 5 still living.   1 son a & w          Past Surgical History  Procedure Laterality Date  . Right oophorectomy  1968    No family history on file.  Allergies  Allergen Reactions  . Ace Inhibitors     cough    Current Outpatient Prescriptions on File Prior to Visit  Medication Sig Dispense Refill  . amLODipine (NORVASC) 10 MG tablet Take 1 tablet (10 mg total) by mouth daily.  90 tablet  1  . aspirin EC 81 MG tablet Take 81 mg by mouth daily.        . Calcium Carbonate-Vitamin D (CALTRATE 600+D) 600-400 MG-UNIT per tablet Take 1 tablet by mouth 2 (two) times daily.      . cholecalciferol (VITAMIN D) 1000 UNITS tablet Take 2,000 Units by mouth daily.      Marland Kitchen docusate sodium (COLACE) 100 MG capsule Take 1 capsule by mouth 1 to 2 times daily as needed.      . ferrous sulfate (FERROUSUL) 325 (65 FE) MG tablet Take 325  mg by mouth daily with breakfast.       . hydrochlorothiazide (MICROZIDE) 12.5 MG capsule Take 1 capsule (12.5 mg total) by mouth daily.  90 capsule  1  . losartan (COZAAR) 25 MG tablet Take 1 tablet (25 mg total) by mouth daily.  90 tablet  1  . Omega-3 Fatty Acids (FISH OIL) 1000 MG CAPS Take 1 capsule by mouth daily. Currently takes it once a week in the summer.      . [DISCONTINUED] lisinopril (PRINIVIL,ZESTRIL) 10 MG tablet Take 1 tablet (10 mg total) by mouth daily.  30 tablet  3   No current facility-administered medications on file prior to visit.    BP 148/80  Pulse 78  Temp(Src) 97.9 F (36.6 C) (Oral)  Resp 16  Ht 5\' 4"  (1.626 m)  Wt 162 lb 1.3 oz (73.519 kg)  BMI 27.81 kg/m2  SpO2 97%       Objective:   Physical Exam  Constitutional: She is oriented to person, place, and time.  She appears well-developed and well-nourished. No distress.  HENT:  Head: Normocephalic and atraumatic.  Cardiovascular: Normal rate and regular rhythm.   No murmur heard. Pulmonary/Chest: Effort normal and breath sounds normal. No respiratory distress. She has no wheezes. She has no rales. She exhibits no tenderness.  Musculoskeletal: She exhibits edema.  2+ bilateral pedal edema  Neurological: She is alert and oriented to person, place, and time.  Psychiatric: She has a normal mood and affect. Her behavior is normal. Judgment and thought content normal.          Assessment & Plan:

## 2013-04-23 ENCOUNTER — Ambulatory Visit: Payer: Federal, State, Local not specified - PPO | Admitting: Family

## 2013-04-28 ENCOUNTER — Encounter: Payer: Self-pay | Admitting: Family

## 2013-04-28 ENCOUNTER — Ambulatory Visit (INDEPENDENT_AMBULATORY_CARE_PROVIDER_SITE_OTHER): Payer: Federal, State, Local not specified - PPO | Admitting: Family

## 2013-04-28 VITALS — BP 136/80 | HR 76 | Temp 98.2°F | Resp 16 | Ht 64.0 in | Wt 162.0 lb

## 2013-04-28 DIAGNOSIS — I10 Essential (primary) hypertension: Secondary | ICD-10-CM

## 2013-04-28 LAB — BASIC METABOLIC PANEL
Calcium: 9.2 mg/dL (ref 8.4–10.5)
Creat: 0.8 mg/dL (ref 0.50–1.10)
Sodium: 142 mEq/L (ref 135–145)

## 2013-04-28 MED ORDER — LOSARTAN POTASSIUM 25 MG PO TABS: 50.0000 mg | ORAL_TABLET | Freq: Every day | ORAL | Status: DC

## 2013-04-28 NOTE — Patient Instructions (Signed)
Please follow up in 3 months.  

## 2013-04-28 NOTE — Assessment & Plan Note (Signed)
BP stable on increased dose of losartan. Continue same, obtain bmet.

## 2013-04-28 NOTE — Progress Notes (Signed)
Subjective:    Patient ID: Brandy Stevens, female    DOB: 1935/09/01, 77 y.o.   MRN: [id number]  HPI Brandy Stevens is a 77 year old female who presents today for follow-up of Hypertension. BP today 136/80 today. Patient denies chest pain and shortness of breath, reports to be feeling better.   Review of Systems  Respiratory: Negative for cough, chest tightness and shortness of breath.   Cardiovascular: Negative for chest pain.  Neurological: Negative for light-headedness and headaches.   Past Medical History  Diagnosis Date  . History of chicken pox   . UTI (lower urinary tract infection)   . Allergy     allergic rhinitis  . Anemia     nos  . Hypertension   . Systolic murmur     Normal valves on 2-d echo 5/11    History   Social History  . Marital Status: Widowed    Spouse Name: N/A    Number of Children: 1  . Years of Education: N/A   Occupational History  . NURSE    Social History Main Topics  . Smoking status: Never Smoker   . Smokeless tobacco: Never Used  . Alcohol Use: No  . Drug Use: No  . Sexual Activity: Not on file   Other Topics Concern  . Not on file   Social History Narrative   7 brothers-- 1 died from unknown cause, 1 died from pneumonia. 5 still living.   1 son a & w          Past Surgical History  Procedure Laterality Date  . Right oophorectomy  1968    No family history on file.  Allergies  Allergen Reactions  . Ace Inhibitors     cough    Current Outpatient Prescriptions on File Prior to Visit  Medication Sig Dispense Refill  . amLODipine (NORVASC) 10 MG tablet Take 1 tablet (10 mg total) by mouth daily.  90 tablet  1  . aspirin EC 81 MG tablet Take 81 mg by mouth daily.        . Calcium Carbonate-Vitamin D (CALTRATE 600+D) 600-400 MG-UNIT per tablet Take 1 tablet by mouth 2 (two) times daily.      . cholecalciferol (VITAMIN D) 1000 UNITS tablet Take 2,000 Units by mouth daily.      Marland Kitchen docusate sodium (COLACE) 100 MG capsule Take 1  capsule by mouth 1 to 2 times daily as needed.      . ferrous sulfate (FERROUSUL) 325 (65 FE) MG tablet Take 325 mg by mouth daily with breakfast.       . hydrochlorothiazide (MICROZIDE) 12.5 MG capsule Take 1 capsule (12.5 mg total) by mouth daily.  90 capsule  1  . Omega-3 Fatty Acids (FISH OIL) 1000 MG CAPS Take 1 capsule by mouth daily. Currently takes it once a week in the summer.      . [DISCONTINUED] lisinopril (PRINIVIL,ZESTRIL) 10 MG tablet Take 1 tablet (10 mg total) by mouth daily.  30 tablet  3   No current facility-administered medications on file prior to visit.    BP 136/80  Pulse 76  Temp(Src) 98.2 F (36.8 C) (Oral)  Resp 16  Ht 5\' 4"  (1.626 m)  Wt 162 lb 0.6 oz (73.501 kg)  BMI 27.80 kg/m2  SpO2 97%        Objective:   Physical Exam  Constitutional: She is oriented to person, place, and time. She appears well-nourished.  HENT:  Head: Normocephalic.  Cardiovascular: Normal rate, regular rhythm and intact distal pulses.   Murmur heard. Pulses:      Carotid pulses are 2+ on the right side, and 2+ on the left side.      Radial pulses are 2+ on the right side, and 2+ on the left side.       Dorsalis pedis pulses are 2+ on the right side, and 2+ on the left side.       Posterior tibial pulses are 2+ on the right side, and 2+ on the left side.  Pulmonary/Chest: Effort normal and breath sounds normal. No respiratory distress. She has no wheezes.  Neurological: She is alert and oriented to person, place, and time.  Skin: Skin is warm and dry.  Psychiatric: She has a normal mood and affect.          Assessment & Plan:

## 2013-04-30 ENCOUNTER — Encounter: Payer: Self-pay | Admitting: Family

## 2013-07-30 ENCOUNTER — Ambulatory Visit: Payer: Federal, State, Local not specified - PPO | Admitting: Family

## 2013-08-06 ENCOUNTER — Encounter: Payer: Self-pay | Admitting: Family

## 2013-08-06 ENCOUNTER — Ambulatory Visit (INDEPENDENT_AMBULATORY_CARE_PROVIDER_SITE_OTHER): Payer: Federal, State, Local not specified - PPO | Admitting: Family

## 2013-08-06 VITALS — BP 122/80 | HR 82 | Temp 97.6°F | Resp 16 | Ht 64.0 in | Wt 161.0 lb

## 2013-08-06 DIAGNOSIS — E559 Vitamin D deficiency, unspecified: Secondary | ICD-10-CM

## 2013-08-06 DIAGNOSIS — I10 Essential (primary) hypertension: Secondary | ICD-10-CM

## 2013-08-06 DIAGNOSIS — D649 Anemia, unspecified: Secondary | ICD-10-CM

## 2013-08-06 DIAGNOSIS — D509 Iron deficiency anemia, unspecified: Secondary | ICD-10-CM

## 2013-08-06 LAB — CBC WITH DIFFERENTIAL/PLATELET
BASOS PCT: 1 % (ref 0–1)
Basophils Absolute: 0 10*3/uL (ref 0.0–0.1)
EOS PCT: 4 % (ref 0–5)
Eosinophils Absolute: 0.2 10*3/uL (ref 0.0–0.7)
HCT: 34.3 % — ABNORMAL LOW (ref 36.0–46.0)
Hemoglobin: 11.3 g/dL — ABNORMAL LOW (ref 12.0–15.0)
Lymphocytes Relative: 31 % (ref 12–46)
Lymphs Abs: 1.2 10*3/uL (ref 0.7–4.0)
MCH: 27.2 pg (ref 26.0–34.0)
MCHC: 32.9 g/dL (ref 30.0–36.0)
MCV: 82.5 fL (ref 78.0–100.0)
Monocytes Absolute: 0.3 10*3/uL (ref 0.1–1.0)
Monocytes Relative: 7 % (ref 3–12)
NEUTROS PCT: 57 % (ref 43–77)
Neutro Abs: 2.3 10*3/uL (ref 1.7–7.7)
PLATELETS: 230 10*3/uL (ref 150–400)
RBC: 4.16 MIL/uL (ref 3.87–5.11)
RDW: 14.7 % (ref 11.5–15.5)
WBC: 4 10*3/uL (ref 4.0–10.5)

## 2013-08-06 LAB — BASIC METABOLIC PANEL
BUN: 13 mg/dL (ref 6–23)
CALCIUM: 9 mg/dL (ref 8.4–10.5)
CO2: 30 mEq/L (ref 19–32)
CREATININE: 0.86 mg/dL (ref 0.50–1.10)
Chloride: 102 mEq/L (ref 96–112)
GLUCOSE: 86 mg/dL (ref 70–99)
Potassium: 3.9 mEq/L (ref 3.5–5.3)
Sodium: 141 mEq/L (ref 135–145)

## 2013-08-06 LAB — IRON: IRON: 52 ug/dL (ref 42–145)

## 2013-08-06 MED ORDER — HYDROCHLOROTHIAZIDE 12.5 MG PO CAPS: 12.5000 mg | ORAL_CAPSULE | Freq: Every day | ORAL | Status: DC

## 2013-08-06 MED ORDER — AMLODIPINE BESYLATE 10 MG PO TABS: 10.0000 mg | ORAL_TABLET | Freq: Every day | ORAL | Status: DC

## 2013-08-06 MED ORDER — LOSARTAN POTASSIUM 25 MG PO TABS: 50.0000 mg | ORAL_TABLET | Freq: Every day | ORAL | Status: DC

## 2013-08-06 NOTE — Progress Notes (Signed)
Pre visit review using our clinic review tool, if applicable. No additional management support is needed unless otherwise documented below in the visit note. 

## 2013-08-06 NOTE — Assessment & Plan Note (Signed)
BP stable on current meds. Continue same, obtain bmet.  

## 2013-08-06 NOTE — Progress Notes (Signed)
Subjective:    Patient ID: Brandy Stevens, female    DOB: 05/16/1936, [age] y.o.   MRN: [id number]  HPI  Brandy Stevens is a 78 yr old female who presents today for follow up of her HTN. Current medications include:  Losartain, amlodipine, and hctz.  Denies CP/SOB or swelling.   BP Readings from Last 3 Encounters:  08/06/13 122/80  04/28/13 136/80  04/09/13 148/80   Anemia- she is maintained on iron supplement.  Uses colace prn for constipation.   Vitamin D deficiency- continues vitamin D supplement.   Review of Systems See HPI Past Medical History  Diagnosis Date  . History of chicken pox   . UTI (lower urinary tract infection)   . Allergy     allergic rhinitis  . Anemia     nos  . Hypertension   . Systolic murmur     Normal valves on 2-d echo 5/11    History   Social History  . Marital Status: Widowed    Spouse Name: N/A    Number of Children: 1  . Years of Education: N/A   Occupational History  . NURSE    Social History Main Topics  . Smoking status: Never Smoker   . Smokeless tobacco: Never Used  . Alcohol Use: No  . Drug Use: No  . Sexual Activity: Not on file   Other Topics Concern  . Not on file   Social History Narrative   7 brothers-- 1 died from unknown cause, 1 died from pneumonia. 5 still living.   1 son a & w          Past Surgical History  Procedure Laterality Date  . Right oophorectomy  1968    No family history on file.  Allergies  Allergen Reactions  . Ace Inhibitors     cough    Current Outpatient Prescriptions on File Prior to Visit  Medication Sig Dispense Refill  . amLODipine (NORVASC) 10 MG tablet Take 1 tablet (10 mg total) by mouth daily.  90 tablet  1  . aspirin EC 81 MG tablet Take 81 mg by mouth daily.        . Calcium Carbonate-Vitamin D (CALTRATE 600+D) 600-400 MG-UNIT per tablet Take 1 tablet by mouth 2 (two) times daily.      . cholecalciferol (VITAMIN D) 1000 UNITS tablet Take 2,000 Units by mouth daily.      Marland Kitchen.  docusate sodium (COLACE) 100 MG capsule Take 1 capsule by mouth 1 to 2 times daily as needed.      . ferrous sulfate (FERROUSUL) 325 (65 FE) MG tablet Take 325 mg by mouth daily with breakfast.       . hydrochlorothiazide (MICROZIDE) 12.5 MG capsule Take 1 capsule (12.5 mg total) by mouth daily.  90 capsule  1  . losartan (COZAAR) 25 MG tablet Take 2 tablets (50 mg total) by mouth daily.  60 tablet  2  . Omega-3 Fatty Acids (FISH OIL) 1000 MG CAPS Take 1 capsule by mouth daily. Currently takes it once a week in the summer.      . [DISCONTINUED] lisinopril (PRINIVIL,ZESTRIL) 10 MG tablet Take 1 tablet (10 mg total) by mouth daily.  30 tablet  3   No current facility-administered medications on file prior to visit.    BP 122/80  Pulse 82  Temp(Src) 97.6 F (36.4 C) (Oral)  Resp 16  Ht 5\' 4"  (1.626 m)  Wt 161 lb 0.6 oz (73.047 kg)  BMI 27.63 kg/m2  SpO2 99%         Objective:   Physical Exam  Constitutional: She appears well-developed and well-nourished. No distress.  Cardiovascular: Normal rate and regular rhythm.   Murmur heard.  Systolic murmur is present with a grade of 3/6   Diastolic murmur is present with a grade of 3/6  Pulmonary/Chest: Effort normal and breath sounds normal. No respiratory distress. She has no wheezes. She has no rales. She exhibits no tenderness.  Musculoskeletal:  Trace bilateral LE edema is noted.           Assessment & Plan:

## 2013-08-06 NOTE — Assessment & Plan Note (Signed)
Check CBC, iron level, continue iron.

## 2013-08-06 NOTE — Assessment & Plan Note (Signed)
Continue supplement, check vitamin D level.

## 2013-08-06 NOTE — Patient Instructions (Signed)
Please complete lab work prior to leaving. Schedule medicare wellness visit at the front desk.  

## 2013-08-07 ENCOUNTER — Telehealth: Payer: Self-pay | Admitting: Family

## 2013-08-07 NOTE — Telephone Encounter (Signed)
Relevant patient education mailed to patient.  

## 2013-08-08 ENCOUNTER — Encounter: Payer: Self-pay | Admitting: Family

## 2013-08-08 LAB — VITAMIN D 1,25 DIHYDROXY
VITAMIN D 1, 25 (OH) TOTAL: 62 pg/mL (ref 18–72)
VITAMIN D3 1, 25 (OH): 62 pg/mL
Vitamin D2 1, 25 (OH)2: <8 pg/mL

## 2013-09-03 ENCOUNTER — Telehealth: Payer: Self-pay | Admitting: Family

## 2013-09-03 ENCOUNTER — Encounter: Payer: Self-pay | Admitting: Family

## 2013-09-03 ENCOUNTER — Ambulatory Visit (INDEPENDENT_AMBULATORY_CARE_PROVIDER_SITE_OTHER): Payer: Federal, State, Local not specified - PPO | Admitting: Family

## 2013-09-03 VITALS — BP 112/66 | HR 77 | Temp 98.6°F | Resp 16 | Ht 63.0 in | Wt 162.0 lb

## 2013-09-03 DIAGNOSIS — E559 Vitamin D deficiency, unspecified: Secondary | ICD-10-CM

## 2013-09-03 DIAGNOSIS — M899 Disorder of bone, unspecified: Secondary | ICD-10-CM

## 2013-09-03 DIAGNOSIS — D649 Anemia, unspecified: Secondary | ICD-10-CM

## 2013-09-03 DIAGNOSIS — Z1239 Encounter for other screening for malignant neoplasm of breast: Secondary | ICD-10-CM

## 2013-09-03 DIAGNOSIS — R011 Cardiac murmur, unspecified: Secondary | ICD-10-CM

## 2013-09-03 DIAGNOSIS — M8580 Other specified disorders of bone density and structure, unspecified site: Secondary | ICD-10-CM

## 2013-09-03 DIAGNOSIS — E785 Hyperlipidemia, unspecified: Secondary | ICD-10-CM

## 2013-09-03 DIAGNOSIS — M949 Disorder of cartilage, unspecified: Secondary | ICD-10-CM

## 2013-09-03 DIAGNOSIS — I10 Essential (primary) hypertension: Secondary | ICD-10-CM

## 2013-09-03 LAB — LIPID PANEL
CHOL/HDL RATIO: 3.5 ratio
Cholesterol: 207 mg/dL — ABNORMAL HIGH (ref 0–200)
HDL: 59 mg/dL (ref >39)
LDL Cholesterol: 135 mg/dL — ABNORMAL HIGH (ref 0–99)
Triglycerides: 66 mg/dL (ref <150)
VLDL: 13 mg/dL (ref 0–40)

## 2013-09-03 NOTE — Patient Instructions (Addendum)
Please complete lab work prior to leaving. Please work towards 30 minutes of exercise 5 days a week. Schedule mammogram on the first floor. We will contact you with your bone density test.  Follow up in 4 months.

## 2013-09-03 NOTE — Progress Notes (Signed)
Pre visit review using our clinic review tool, if applicable. No additional management support is needed unless otherwise documented below in the visit note. 

## 2013-09-03 NOTE — Telephone Encounter (Signed)
Relevant patient education mailed to patient.  

## 2013-09-03 NOTE — Assessment & Plan Note (Signed)
Level stable on current dose of otc vitamin D, continue same.

## 2013-09-03 NOTE — Progress Notes (Signed)
Patient ID: Brandy Stevens, female   DOB: 1936-02-01, 78 y.o.   MRN: [id number] Subjective:   Patient here for Medicare annual wellness visit and management of other chronic and acute problems.  Immunizations: declines pneumovax and shingles vaccine.  Tetanus up to date Diet: reports healthy diet Exercise: started walking every 3rd saturday Colonoscopy: up to date Dexa: due Pap Smear: due- declines Mammogram: due  HTN- maintained on amlodipine and hctz and losartan. Had recent normal bmet. Denies CP/SOB,  Denies LE edema.  Anemia- she is maintained on iron supplement.  Iron level stable last month.  Hgb mildly low but stable.   Lab Results  Component Value Date   WBC 4.0 08/06/2013   HGB 11.3* 08/06/2013   HCT 34.3* 08/06/2013   MCV 82.5 08/06/2013   PLT 230 08/06/2013   Osteopenia/vitamin D deficiency- on otc vitamin D supplement. Most recent vitamin D level was normal.   Heart murmur- She had a 2D echo in 2011- noted trivial regurg of aortic valve and mild SAM of the mitral valve.  LVEF was 65-70 at that time.   Risk factors: at risk for fracture due to osteopenia.  Roster of Physicians Providing Medical Care to Patient: None  Activities of Daily Living  In your present state of health, do you have any difficulty performing the following activities? Preparing food and eating?: No  Bathing yourself: No  Getting dressed: No  Using the toilet:No  Moving around from place to place: No  In the past year have you fallen or had a near fall?:No    Home Safety: Has smoke detector and wears seat belts. No firearms. No excess sun exposure.  Diet and Exercise  Current exercise habits:  Dietary issues discussed: healthy diet   Depression Screen  (Note: if answer to either of the following is "Yes", then a more complete depression screening is indicated)  Q1: Over the past two weeks, have you felt down, depressed or hopeless?no  Q2: Over the past two weeks, have you felt little interest  or pleasure in doing things? no   The following portions of the patient's history were reviewed and updated as appropriate: allergies, current medications, past family history, past medical history, past social history, past surgical history and problem list.    Objective:   Vision: see nursing.  Hearing:  Able to hear forced whisper at 6 feet Body mass index: Body mass index is 28.7 kg/(m^2). Cognitive Impairment Assessment: cognition, memory and judgment appear normal.   Physical Exam  Constitutional: She is oriented to person, place, and time. She appears well-developed and well-nourished. No distress.  HENT:  Head: Normocephalic and atraumatic.  Right Ear: Tympanic membrane and ear canal normal.  Left Ear: Tympanic membrane and ear canal normal.  Mouth/Throat: Oropharynx is clear and moist.  Eyes: Pupils are equal, round, and reactive to light. No scleral icterus.  Neck: Normal range of motion. No thyromegaly present.  Cardiovascular: Normal rate and regular rhythm.   + murmur heard.- grade 2/6 Pulmonary/Chest: Effort normal and breath sounds normal. No respiratory distress. He has no wheezes. She has no rales. She exhibits no tenderness.  Abdominal: Soft. Bowel sounds are normal. He exhibits no distension and no mass. There is no tenderness. There is no rebound and no guarding.  Musculoskeletal: She exhibits 1+ bilateral LE edema.  Lymphadenopathy:    She has no cervical adenopathy.  Neurological: She is alert and oriented to person, place, and time.  She exhibits normal muscle tone. Coordination  normal.  Skin: Skin is warm and dry.  Psychiatric: She has a normal mood and affect. Her behavior is normal. Judgment and thought content normal.  Breasts: Examined lying Right: Without masses, retractions, discharge or axillary adenopathy.  Left: Without masses, retractions, discharge or axillary adenopathy.  Pelvic: declined         Assessment & Plan:    Assessment:    Medicare wellness utd on preventive parameters  Plan:    During the course of the visit the patient was educated and counseled about appropriate screening and preventive services including:     Screening mammography         Bone densitometry screening   Vaccines / LABS  Fasting cholesterol- she will return to lab Patient Instructions (the written plan) was given to the patient.

## 2013-09-03 NOTE — Assessment & Plan Note (Signed)
Ordered follow up dexa scan. Discussed importance of regular exercise.

## 2013-09-03 NOTE — Assessment & Plan Note (Signed)
Unchanged, clinically compensated.  Monitor.

## 2013-09-03 NOTE — Assessment & Plan Note (Signed)
BP stable on current meds. Continue same.  

## 2013-09-03 NOTE — Assessment & Plan Note (Signed)
Stable on iron supplement, continue same.

## 2013-09-04 ENCOUNTER — Encounter: Payer: Self-pay | Admitting: Family

## 2013-09-17 ENCOUNTER — Ambulatory Visit (INDEPENDENT_AMBULATORY_CARE_PROVIDER_SITE_OTHER)
Admission: RE | Admit: 2013-09-17 | Discharge: 2013-09-17 | Disposition: A | Discharge status: Home / Self Care | Payer: Federal, State, Local not specified - PPO | Source: Ambulatory Visit | Attending: Family | Admitting: Family

## 2013-09-17 DIAGNOSIS — M899 Disorder of bone, unspecified: Secondary | ICD-10-CM

## 2013-09-17 DIAGNOSIS — M949 Disorder of cartilage, unspecified: Secondary | ICD-10-CM

## 2013-09-17 DIAGNOSIS — Z1239 Encounter for other screening for malignant neoplasm of breast: Secondary | ICD-10-CM

## 2013-09-29 ENCOUNTER — Encounter: Payer: Self-pay | Admitting: Family

## 2013-11-27 ENCOUNTER — Other Ambulatory Visit: Payer: Self-pay | Admitting: Family

## 2013-11-27 DIAGNOSIS — Z1231 Encounter for screening mammogram for malignant neoplasm of breast: Secondary | ICD-10-CM

## 2013-12-05 ENCOUNTER — Ambulatory Visit (HOSPITAL_BASED_OUTPATIENT_CLINIC_OR_DEPARTMENT_OTHER): Payer: Federal, State, Local not specified - PPO

## 2013-12-10 ENCOUNTER — Ambulatory Visit (HOSPITAL_BASED_OUTPATIENT_CLINIC_OR_DEPARTMENT_OTHER)
Admission: RE | Admit: 2013-12-10 | Discharge: 2013-12-10 | Disposition: A | Discharge status: Home / Self Care | Payer: Federal, State, Local not specified - PPO | Source: Ambulatory Visit | Attending: Family | Admitting: Family

## 2013-12-10 DIAGNOSIS — Z1231 Encounter for screening mammogram for malignant neoplasm of breast: Secondary | ICD-10-CM

## 2014-01-07 ENCOUNTER — Ambulatory Visit (INDEPENDENT_AMBULATORY_CARE_PROVIDER_SITE_OTHER): Payer: Federal, State, Local not specified - PPO | Admitting: Family

## 2014-01-07 ENCOUNTER — Encounter: Payer: Self-pay | Admitting: Family

## 2014-01-07 VITALS — BP 100/70 | HR 70 | Temp 98.3°F | Resp 16 | Ht 63.0 in | Wt 159.0 lb

## 2014-01-07 DIAGNOSIS — E785 Hyperlipidemia, unspecified: Secondary | ICD-10-CM

## 2014-01-07 DIAGNOSIS — D509 Iron deficiency anemia, unspecified: Secondary | ICD-10-CM

## 2014-01-07 DIAGNOSIS — I10 Essential (primary) hypertension: Secondary | ICD-10-CM

## 2014-01-07 DIAGNOSIS — D649 Anemia, unspecified: Secondary | ICD-10-CM

## 2014-01-07 LAB — CBC WITH DIFFERENTIAL/PLATELET
Basophils Absolute: 0 10*3/uL (ref 0.0–0.1)
Basophils Relative: 1 % (ref 0–1)
Eosinophils Absolute: 0.2 10*3/uL (ref 0.0–0.7)
Eosinophils Relative: 4 % (ref 0–5)
HCT: 34.1 % — ABNORMAL LOW (ref 36.0–46.0)
Hemoglobin: 11.3 g/dL — ABNORMAL LOW (ref 12.0–15.0)
LYMPHS ABS: 1.9 10*3/uL (ref 0.7–4.0)
LYMPHS PCT: 39 % (ref 12–46)
MCH: 27.7 pg (ref 26.0–34.0)
MCHC: 33.1 g/dL (ref 30.0–36.0)
MCV: 83.6 fL (ref 78.0–100.0)
Monocytes Absolute: 0.4 10*3/uL (ref 0.1–1.0)
Monocytes Relative: 8 % (ref 3–12)
Neutro Abs: 2.3 10*3/uL (ref 1.7–7.7)
Neutrophils Relative %: 48 % (ref 43–77)
PLATELETS: 233 10*3/uL (ref 150–400)
RBC: 4.08 MIL/uL (ref 3.87–5.11)
RDW: 14.4 % (ref 11.5–15.5)
WBC: 4.8 10*3/uL (ref 4.0–10.5)

## 2014-01-07 LAB — BASIC METABOLIC PANEL
BUN: 15 mg/dL (ref 6–23)
CO2: 30 mEq/L (ref 19–32)
Calcium: 9 mg/dL (ref 8.4–10.5)
Chloride: 104 mEq/L (ref 96–112)
Creat: 0.78 mg/dL (ref 0.50–1.10)
Glucose, Bld: 94 mg/dL (ref 70–99)
POTASSIUM: 4.2 meq/L (ref 3.5–5.3)
Sodium: 142 mEq/L (ref 135–145)

## 2014-01-07 LAB — IRON: IRON: 73 ug/dL (ref 42–145)

## 2014-01-07 MED ORDER — LOSARTAN POTASSIUM 50 MG PO TABS: 50.0000 mg | ORAL_TABLET | Freq: Every day | ORAL | Status: DC

## 2014-01-07 MED ORDER — AMLODIPINE BESYLATE 10 MG PO TABS: 10.0000 mg | ORAL_TABLET | Freq: Every day | ORAL | Status: DC

## 2014-01-07 MED ORDER — HYDROCHLOROTHIAZIDE 12.5 MG PO CAPS: 12.5000 mg | ORAL_CAPSULE | Freq: Every day | ORAL | Status: DC

## 2014-01-07 NOTE — Assessment & Plan Note (Signed)
Advised pt to work on low cholesterol diet.

## 2014-01-07 NOTE — Progress Notes (Signed)
Pre visit review using our clinic review tool, if applicable. No additional management support is needed unless otherwise documented below in the visit note. 

## 2014-01-07 NOTE — Progress Notes (Signed)
Subjective:    Patient ID: Brandy Stevens, female    DOB: 09/07/1935, 78 y.o.   MRN: [id number]  HPI  Brandy Stevens is a 78 yr old female who presents today for follow up.  1) HTN- Currently maintianed on losartan, amlodipine, hctz.   BP Readings from Last 3 Encounters:  01/07/14 100/70  09/03/13 112/66  08/06/13 122/80  Denies cp or sob, mile lower extem edema.  2) Hyperlipidemia-   Lab Results  Component Value Date   CHOL 207* 09/03/2013   HDL 59 09/03/2013   LDLCALC 135* 09/03/2013   TRIG 66 09/03/2013   CHOLHDL 3.5 09/03/2013   3) Anemia-  Maintained on iron.  Reports stools are dark due to iron, denies fatigue.   Lab Results  Component Value Date   WBC 4.0 08/06/2013   HGB 11.3* 08/06/2013   HCT 34.3* 08/06/2013   MCV 82.5 08/06/2013   PLT 230 08/06/2013    Review of Systems See HPI  Past Medical History  Diagnosis Date  . History of chicken pox   . UTI (lower urinary tract infection)   . Allergy     allergic rhinitis  . Anemia     nos  . Hypertension   . Systolic murmur     Normal valves on 2-d echo 5/11    History   Social History  . Marital Status: Widowed    Spouse Name: N/A    Number of Children: 1  . Years of Education: N/A   Occupational History  . NURSE    Social History Main Topics  . Smoking status: Never Smoker   . Smokeless tobacco: Never Used  . Alcohol Use: No  . Drug Use: No  . Sexual Activity: Not on file   Other Topics Concern  . Not on file   Social History Narrative   7 brothers-- 1 died from unknown cause, 1 died from pneumonia. 5 still living.   1 son a & w          Past Surgical History  Procedure Laterality Date  . Right oophorectomy  1968    No family history on file.  Allergies  Allergen Reactions  . Ace Inhibitors     cough    Current Outpatient Prescriptions on File Prior to Visit  Medication Sig Dispense Refill  . amLODipine (NORVASC) 10 MG tablet Take 1 tablet (10 mg total) by mouth daily.  90 tablet  1    . aspirin EC 81 MG tablet Take 81 mg by mouth daily.        . Calcium Carbonate-Vitamin D (CALTRATE 600+D) 600-400 MG-UNIT per tablet Take 1 tablet by mouth 2 (two) times daily.      . cholecalciferol (VITAMIN D) 1000 UNITS tablet Take 2,000 Units by mouth daily.      Marland Kitchen docusate sodium (COLACE) 100 MG capsule Take 1 capsule by mouth 1 to 2 times daily as needed.      . ferrous sulfate (FERROUSUL) 325 (65 FE) MG tablet Take 325 mg by mouth daily with breakfast.       . hydrochlorothiazide (MICROZIDE) 12.5 MG capsule Take 1 capsule (12.5 mg total) by mouth daily.  90 capsule  1  . losartan (COZAAR) 25 MG tablet Take 2 tablets (50 mg total) by mouth daily.  60 tablet  2  . Omega-3 Fatty Acids (FISH OIL) 1000 MG CAPS Take 1 capsule by mouth daily. Currently takes it once a week in the summer.      . [  DISCONTINUED] lisinopril (PRINIVIL,ZESTRIL) 10 MG tablet Take 1 tablet (10 mg total) by mouth daily.  30 tablet  3   No current facility-administered medications on file prior to visit.    BP 100/70  Pulse 70  Temp(Src) 98.3 F (36.8 C) (Oral)  Resp 16  Ht 5\' 3"  (1.6 m)  Wt 159 lb 0.6 oz (72.14 kg)  BMI 28.18 kg/m2  SpO2 99%       Objective:   Physical Exam  Constitutional: She is oriented to person, place, and time. She appears well-developed and well-nourished. No distress.  Cardiovascular: Normal rate and regular rhythm.   Murmur heard.  Systolic murmur is present with a grade of 2/6  Neurological: She is alert and oriented to person, place, and time.  Psychiatric: She has a normal mood and affect. Her behavior is normal. Judgment and thought content normal.          Assessment & Plan:

## 2014-01-07 NOTE — Assessment & Plan Note (Signed)
Will obtain IFOB, cbc, iron level, continue po iron.

## 2014-01-07 NOTE — Patient Instructions (Addendum)
Please complete lab work prior to leaving. Complete stool kit and return by mail.   Follow up in 6 months.

## 2014-01-07 NOTE — Assessment & Plan Note (Signed)
Continue current meds.  Obtain bmet.  BP stable.

## 2014-01-08 ENCOUNTER — Encounter: Payer: Self-pay | Admitting: Family

## 2014-01-21 ENCOUNTER — Telehealth: Payer: Self-pay | Admitting: *Deleted

## 2014-01-21 DIAGNOSIS — D649 Anemia, unspecified: Secondary | ICD-10-CM

## 2014-01-21 NOTE — Telephone Encounter (Signed)
Message copied by Kathi SimpersFERGERSON, Calianne Larue A on Wed Jan 21, 2014 10:42 AM ------      Message from: O'SULLIVAN, MELISSA      Created: Wed Jan 07, 2014  8:39 AM       Pt will complete ifob dx anemia ------

## 2014-01-21 NOTE — Telephone Encounter (Signed)
Lab order entered.

## 2014-02-03 ENCOUNTER — Encounter: Payer: Self-pay | Admitting: Family

## 2014-02-03 ENCOUNTER — Other Ambulatory Visit (INDEPENDENT_AMBULATORY_CARE_PROVIDER_SITE_OTHER): Payer: Federal, State, Local not specified - PPO

## 2014-02-03 DIAGNOSIS — D649 Anemia, unspecified: Secondary | ICD-10-CM

## 2014-02-03 LAB — FECAL OCCULT BLOOD, IMMUNOCHEMICAL: FECAL OCCULT BLD: NEGATIVE

## 2014-06-04 ENCOUNTER — Encounter: Payer: Self-pay | Admitting: Medical

## 2014-06-04 ENCOUNTER — Ambulatory Visit (INDEPENDENT_AMBULATORY_CARE_PROVIDER_SITE_OTHER): Payer: Federal, State, Local not specified - PPO | Admitting: Medical

## 2014-06-04 VITALS — BP 157/78 | HR 92 | Temp 99.1°F | Wt 158.4 lb

## 2014-06-04 DIAGNOSIS — J069 Acute upper respiratory infection, unspecified: Secondary | ICD-10-CM | POA: Insufficient documentation

## 2014-06-04 MED ORDER — BENZONATATE 100 MG PO CAPS: 100.0000 mg | ORAL_CAPSULE | Freq: Three times a day (TID) | ORAL | Status: DC | PRN

## 2014-06-04 MED ORDER — AZITHROMYCIN 250 MG PO TABS: ORAL_TABLET | ORAL | Status: DC

## 2014-06-04 MED ORDER — AZITHROMYCIN 250 MG PO TABS
ORAL_TABLET | ORAL | Status: DC
Start: 2014-06-04 — End: 2014-07-15

## 2014-06-04 NOTE — Assessment & Plan Note (Signed)
Your present with mostly uri type symptoms only. I will prescribe benzonatate for the cough and get mucinex otc the counter.  We are approaching the weekend. If your get worse signs and symptoms such as sinusitis or bronchitis then can start azithromycin. I am sending that to your pharmacy to use if you worsen. Antibiotic is not indicated presently.

## 2014-06-04 NOTE — Progress Notes (Signed)
Pre visit review using our clinic review tool, if applicable. No additional management support is needed unless otherwise documented below in the visit note. 

## 2014-06-04 NOTE — Progress Notes (Signed)
Subjective:    Patient ID: Brandy BoxCarolyn Y Deats, female    DOB: 03/08/1936, 78 y.o.   MRN: [id number]  HPI   Pt in today reporting  cough, nasal congestion, scratchy throat  and runny nose for  1 Day.  Cough kept her up last night.   Associated symptoms( below yes or no)  Fever-no Chills-no Chest congestion-no Sneezing- yes. Itching eyes-no Sore throat- mild scratchy Post-nasal drainage-yes Wheezing-no Purulent nasal  drainage-no Fatigue-mild.  Past Medical History  Diagnosis Date  . History of chicken pox   . UTI (lower urinary tract infection)   . Allergy     allergic rhinitis  . Anemia     nos  . Hypertension   . Systolic murmur     Normal valves on 2-d echo 5/11    History   Social History  . Marital Status: Widowed    Spouse Name: N/A    Number of Children: 1  . Years of Education: N/A   Occupational History  . NURSE    Social History Main Topics  . Smoking status: Never Smoker   . Smokeless tobacco: Never Used  . Alcohol Use: No  . Drug Use: No  . Sexual Activity: Not on file   Other Topics Concern  . Not on file   Social History Narrative   7 brothers-- 1 died from unknown cause, 1 died from pneumonia. 5 still living.   1 son a & w          Past Surgical History  Procedure Laterality Date  . Right oophorectomy  1968    No family history on file.  Allergies  Allergen Reactions  . Ace Inhibitors     cough    Current Outpatient Prescriptions on File Prior to Visit  Medication Sig Dispense Refill  . amLODipine (NORVASC) 10 MG tablet Take 1 tablet (10 mg total) by mouth daily. 90 tablet 1  . aspirin EC 81 MG tablet Take 81 mg by mouth daily.      . Calcium Carbonate-Vitamin D (CALTRATE 600+D) 600-400 MG-UNIT per tablet Take 1 tablet by mouth 2 (two) times daily.    . cholecalciferol (VITAMIN D) 1000 UNITS tablet Take 2,000 Units by mouth daily.    Marland Kitchen. docusate sodium (COLACE) 100 MG capsule Take 1 capsule by mouth 1 to 2 times daily as  needed.    . ferrous sulfate (FERROUSUL) 325 (65 FE) MG tablet Take 325 mg by mouth daily with breakfast.     . hydrochlorothiazide (MICROZIDE) 12.5 MG capsule Take 1 capsule (12.5 mg total) by mouth daily. 90 capsule 1  . losartan (COZAAR) 50 MG tablet Take 1 tablet (50 mg total) by mouth daily. 90 tablet 1  . Omega-3 Fatty Acids (FISH OIL) 1000 MG CAPS Take 1 capsule by mouth daily. Currently takes it once a week in the summer.    . [DISCONTINUED] lisinopril (PRINIVIL,ZESTRIL) 10 MG tablet Take 1 tablet (10 mg total) by mouth daily. 30 tablet 3   No current facility-administered medications on file prior to visit.    BP 157/78 mmHg  Pulse 92  Temp(Src) 99.1 F (37.3 C) (Oral)  Wt 158 lb 6.4 oz (71.85 kg)  SpO2 97%       Review of Systems  Constitutional: Positive for fatigue. Negative for fever and chills.       Mild fatigue  HENT: Positive for congestion, rhinorrhea and sore throat. Negative for postnasal drip and sinus pressure.  Faint scratchy sore throat.  Respiratory: Positive for cough. Negative for wheezing.   Cardiovascular: Negative for chest pain and palpitations.  Musculoskeletal: Negative for back pain and neck pain.  Neurological: Negative for dizziness and headaches.  Hematological: Negative for adenopathy. Does not bruise/bleed easily.       Objective:   Physical Exam  General  Mental Status - Alert. General Appearance - Well groomed. Not in acute distress.  Skin Rashes- No Rashes.  HEENT Head- Normal. Ear Auditory Canal - Left- Normal. Right - Normal.Tympanic Membrane- Left- Normal. Right- Normal. Eye Sclera/Conjunctiva- Left- Normal. Right- Normal. Nose & Sinuses Nasal Mucosa- Left-  Not oggy or Congested. Right-  Not  boggy or Congested. Mouth & Throat Lips: Upper Lip- Normal: no dryness, cracking, pallor, cyanosis, or vesicular eruption. Lower Lip-Normal: no dryness, cracking, pallor, cyanosis or vesicular eruption. Buccal Mucosa-  Bilateral- No Aphthous ulcers. Oropharynx- No Discharge or Erythema. Tonsils: Characteristics- Bilateral- faint minimal  Erythema. No  Congestion. Size/Enlargement- Bilateral- No enlargement. Discharge- bilateral-None.  Neck Neck- Supple. No Masses. no lymphadenopathy   Chest and Lung Exam Auscultation: Breath Sounds:- even and unlabored.  Cardiovascular Auscultation:Rythm- Regular, rate and rhythm. Murmurs & Other Heart Sounds:Ausculatation of the heart reveal- No Murmurs.  Lymphatic Head & Neck General Head & Neck Lymphatics: Bilateral: Description- No Localized lymphadenopathy.        Assessment & Plan:

## 2014-06-04 NOTE — Patient Instructions (Addendum)
Your present with mostly uri type symptoms only. I will prescribe benzonatate for the cough and get mucinex otc the counter.  We are approaching the weekend. If your get worse signs and symptoms such as sinusitis or bronchitis then can start azithromycin. I am sending that to your pharmacy to use if you worsen. Antibiotic is not indicated presently.  Follow up in 7 days or as needed.

## 2014-07-13 ENCOUNTER — Ambulatory Visit: Payer: Federal, State, Local not specified - PPO | Admitting: Family

## 2014-07-15 ENCOUNTER — Encounter: Payer: Self-pay | Admitting: Family

## 2014-07-15 ENCOUNTER — Ambulatory Visit (INDEPENDENT_AMBULATORY_CARE_PROVIDER_SITE_OTHER): Payer: Federal, State, Local not specified - PPO | Admitting: Family

## 2014-07-15 ENCOUNTER — Ambulatory Visit: Payer: Federal, State, Local not specified - PPO | Admitting: Family

## 2014-07-15 VITALS — BP 122/72 | HR 82 | Temp 98.1°F | Resp 16 | Ht 63.0 in | Wt 161.8 lb

## 2014-07-15 DIAGNOSIS — D509 Iron deficiency anemia, unspecified: Secondary | ICD-10-CM

## 2014-07-15 DIAGNOSIS — I10 Essential (primary) hypertension: Secondary | ICD-10-CM

## 2014-07-15 DIAGNOSIS — R011 Cardiac murmur, unspecified: Secondary | ICD-10-CM

## 2014-07-15 DIAGNOSIS — R010 Benign and innocent cardiac murmurs: Secondary | ICD-10-CM

## 2014-07-15 DIAGNOSIS — E785 Hyperlipidemia, unspecified: Secondary | ICD-10-CM

## 2014-07-15 LAB — CBC WITH DIFFERENTIAL/PLATELET
BASOS PCT: 0.7 % (ref 0.0–3.0)
Basophils Absolute: 0 10*3/uL (ref 0.0–0.1)
Eosinophils Absolute: 0.2 10*3/uL (ref 0.0–0.7)
Eosinophils Relative: 2.9 % (ref 0.0–5.0)
HCT: 33.7 % — ABNORMAL LOW (ref 36.0–46.0)
HEMOGLOBIN: 11.1 g/dL — AB (ref 12.0–15.0)
Lymphocytes Relative: 32.3 % (ref 12.0–46.0)
Lymphs Abs: 1.7 10*3/uL (ref 0.7–4.0)
MCHC: 32.9 g/dL (ref 30.0–36.0)
MCV: 85.1 fl (ref 78.0–100.0)
MONO ABS: 0.4 10*3/uL (ref 0.1–1.0)
Monocytes Relative: 7.8 % (ref 3.0–12.0)
Neutro Abs: 2.9 10*3/uL (ref 1.4–7.7)
Neutrophils Relative %: 56.3 % (ref 43.0–77.0)
Platelets: 211 10*3/uL (ref 150.0–400.0)
RBC: 3.96 Mil/uL (ref 3.87–5.11)
RDW: 14.8 % (ref 11.5–15.5)
WBC: 5.2 10*3/uL (ref 4.0–10.5)

## 2014-07-15 LAB — LIPID PANEL
Cholesterol: 191 mg/dL (ref 0–200)
HDL: 61.9 mg/dL (ref >39.00)
LDL CALC: 115 mg/dL — AB (ref 0–99)
NONHDL: 129.1
Total CHOL/HDL Ratio: 3
Triglycerides: 72 mg/dL (ref 0.0–149.0)
VLDL: 14.4 mg/dL (ref 0.0–40.0)

## 2014-07-15 LAB — BASIC METABOLIC PANEL
BUN: 20 mg/dL (ref 6–23)
CHLORIDE: 107 meq/L (ref 96–112)
CO2: 31 meq/L (ref 19–32)
Calcium: 9.2 mg/dL (ref 8.4–10.5)
Creatinine, Ser: 0.86 mg/dL (ref 0.40–1.20)
GFR: 82.3 mL/min (ref >60.00)
Glucose, Bld: 99 mg/dL (ref 70–99)
POTASSIUM: 3.6 meq/L (ref 3.5–5.1)
SODIUM: 142 meq/L (ref 135–145)

## 2014-07-15 MED ORDER — AMLODIPINE BESYLATE 10 MG PO TABS: 10.0000 mg | ORAL_TABLET | Freq: Every day | ORAL | Status: DC

## 2014-07-15 MED ORDER — HYDROCHLOROTHIAZIDE 12.5 MG PO CAPS: 12.5000 mg | ORAL_CAPSULE | Freq: Every day | ORAL | Status: DC

## 2014-07-15 MED ORDER — LOSARTAN POTASSIUM 50 MG PO TABS
50.0000 mg | ORAL_TABLET | Freq: Every day | ORAL | Status: DC
Start: 2014-07-15 — End: 2015-03-24

## 2014-07-15 NOTE — Patient Instructions (Addendum)
Please complete lab work prior to leaving.  You will be contacted about your echocardiogram.  Follow up in 3 months for medicare wellness visit.

## 2014-07-15 NOTE — Progress Notes (Signed)
Pre visit review using our clinic review tool, if applicable. No additional management support is needed unless otherwise documented below in the visit note. 

## 2014-07-15 NOTE — Assessment & Plan Note (Signed)
Last echo 2011, will repeat.

## 2014-07-15 NOTE — Progress Notes (Signed)
Subjective:    Patient ID: Brandy Stevens, female    DOB: 10/18/1935, 79 y.o.   MRN: [id number]  HPI  [name] is here today for follow up.  1. HYPERTENSION: Reports compliance with losartan, amlodipine, and HCTZ. The patient denies the following associated symptoms: chest pain, dyspnea, blurred vision, headache, or lower extremity edema. BP Readings from Last 3 Encounters:  07/15/14 122/72  06/04/14 157/78  01/07/14 100/70   2. Iron deficiency anemia: Compliant with 325 Mg ferrous sulfate daily. FOBS was negative 01/2014. Takes Colace PRN for occasional constipation. Lab Results  Component Value Date   WBC 4.8 01/07/2014   HGB 11.3* 01/07/2014   HCT 34.1* 01/07/2014   MCV 83.6 01/07/2014   PLT 233 01/07/2014   3. Hyperlipidemia: Diet - reports she did eat a lot of fatty foods over Christmas.  Lab Results  Component Value Date   CHOL 207* 09/03/2013   HDL 59 09/03/2013   LDLCALC 135* 09/03/2013   TRIG 66 09/03/2013   CHOLHDL 3.5 09/03/2013       Review of Systems See HPI.  Past Medical History  Diagnosis Date  . History of chicken pox   . UTI (lower urinary tract infection)   . Allergy     allergic rhinitis  . Anemia     nos  . Hypertension   . Systolic murmur     Normal valves on 2-d echo 5/11    History   Social History  . Marital Status: Widowed    Spouse Name: N/A    Number of Children: 1  . Years of Education: N/A   Occupational History  . NURSE    Social History Main Topics  . Smoking status: Never Smoker   . Smokeless tobacco: Never Used  . Alcohol Use: No  . Drug Use: No  . Sexual Activity: Not on file   Other Topics Concern  . Not on file   Social History Narrative   7 brothers-- 1 died from unknown cause, 1 died from pneumonia. 5 still living.   1 son a & w          Past Surgical History  Procedure Laterality Date  . Right oophorectomy  1968    History reviewed. No pertinent family history.  Allergies  Allergen  Reactions  . Ace Inhibitors     cough    Current Outpatient Prescriptions on File Prior to Visit  Medication Sig Dispense Refill  . aspirin EC 81 MG tablet Take 81 mg by mouth daily.      . Calcium Carbonate-Vitamin D (CALTRATE 600+D) 600-400 MG-UNIT per tablet Take 1 tablet by mouth 2 (two) times daily.    . cholecalciferol (VITAMIN D) 1000 UNITS tablet Take 2,000 Units by mouth daily.    Marland Kitchen docusate sodium (COLACE) 100 MG capsule Take 1 capsule by mouth 1 to 2 times daily as needed.    . ferrous sulfate (FERROUSUL) 325 (65 FE) MG tablet Take 325 mg by mouth daily with breakfast.     . Omega-3 Fatty Acids (FISH OIL) 1000 MG CAPS Take 1 capsule by mouth daily. Currently takes it once a week in the summer.    . [DISCONTINUED] lisinopril (PRINIVIL,ZESTRIL) 10 MG tablet Take 1 tablet (10 mg total) by mouth daily. 30 tablet 3   No current facility-administered medications on file prior to visit.    BP 122/72 mmHg  Pulse 82  Temp(Src) 98.1 F (36.7 C) (Oral)  Resp 16  Ht 5'  3" (1.6 m)  Wt 161 lb 12.8 oz (73.392 kg)  BMI 28.67 kg/m2  SpO2 99%       Objective:   Physical Exam  Constitutional: She is oriented to person, place, and time. She appears well-developed and well-nourished. No distress.  Cardiovascular: Normal rate and regular rhythm.  Exam reveals no gallop and no friction rub.   Murmur heard.  Systolic murmur is present with a grade of 3/6  Pulmonary/Chest: Effort normal and breath sounds normal. No respiratory distress. She has no wheezes. She has no rales.  Musculoskeletal:  No lower extremity edema.  Neurological: She is alert and oriented to person, place, and time.  Skin: Skin is warm and dry. She is not diaphoretic.  Psychiatric: She has a normal mood and affect. Her behavior is normal. Judgment and thought content normal.          Assessment & Plan:  Patient seen along with [facility] Olander Friedl NP-student.  I have personally seen and examined patient and agree with  Ms. Samantha Ragen's assessment and plan- Sandford CrazeMelissa O'Sullivan NP

## 2014-07-15 NOTE — Assessment & Plan Note (Signed)
Tolerating iron supplement with occasional use of colace for constipation.  Continue same, obtain bmet.

## 2014-07-15 NOTE — Assessment & Plan Note (Signed)
Obtain bmet.  Continue current meds.

## 2014-07-15 NOTE — Assessment & Plan Note (Signed)
Obtain lipid panel

## 2014-07-18 ENCOUNTER — Encounter: Payer: Self-pay | Admitting: Family

## 2014-07-22 ENCOUNTER — Ambulatory Visit (HOSPITAL_BASED_OUTPATIENT_CLINIC_OR_DEPARTMENT_OTHER): Payer: Federal, State, Local not specified - PPO

## 2014-07-27 ENCOUNTER — Ambulatory Visit: Payer: Federal, State, Local not specified - PPO | Admitting: Medical

## 2014-09-07 ENCOUNTER — Telehealth: Payer: Self-pay | Admitting: Family

## 2014-09-07 NOTE — Telephone Encounter (Signed)
Spoke with pt. She states her pharmacy has recently changed manufacturer for amlodipine and HCTZ and pt verified with pharmacy that she is getting correct medication despite change of pill size and color. Pt states she has had dizziness upon rising in the mornings since the manufacturer change. Notes BP readings on these meds 100s on 50s and pulse 52-58. Pt stopped all BP meds x 1 week and has not had any further dizziness. BP now running 120/63 to 128/70 and pulse 62-72. Please advise.

## 2014-09-07 NOTE — Telephone Encounter (Signed)
Notified pt and scheduled nurse visit for 09/22/14 at 9:30am.

## 2014-09-07 NOTE — Telephone Encounter (Signed)
OK to remain of meds. Follow up in office in 2 weeks for nurse visit BP check in office please. Continue to monitor BP at home please.

## 2014-09-07 NOTE — Telephone Encounter (Signed)
Caller name: Audie BoxBoyd, Willena Y Relation to pt: self  Call back number: best# [phone]    Reason for call:  Pt would like to discuss medication, pt states she does't no which medication is causing her not to feel good losartan (COZAAR) 50 MG tablet, hydrochlorothiazide (MICROZIDE) 12.5 MG capsule and amLODipine (NORVASC) 10 MG tablet

## 2014-09-22 ENCOUNTER — Ambulatory Visit (INDEPENDENT_AMBULATORY_CARE_PROVIDER_SITE_OTHER): Payer: Federal, State, Local not specified - PPO | Admitting: *Deleted

## 2014-09-22 VITALS — BP 136/78 | HR 96

## 2014-09-22 DIAGNOSIS — I10 Essential (primary) hypertension: Secondary | ICD-10-CM

## 2014-09-22 NOTE — Progress Notes (Signed)
Pre visit review using our clinic review tool, if applicable. No additional management support is needed unless otherwise documented below in the visit note. 

## 2014-10-21 ENCOUNTER — Ambulatory Visit (HOSPITAL_BASED_OUTPATIENT_CLINIC_OR_DEPARTMENT_OTHER)
Admission: RE | Admit: 2014-10-21 | Discharge: 2014-10-21 | Disposition: A | Discharge status: Home / Self Care | Payer: Federal, State, Local not specified - PPO | Source: Ambulatory Visit | Attending: Family | Admitting: Family

## 2014-10-21 DIAGNOSIS — Z5111 Encounter for antineoplastic chemotherapy: Secondary | ICD-10-CM | POA: Diagnosis not present

## 2014-10-21 DIAGNOSIS — R011 Cardiac murmur, unspecified: Secondary | ICD-10-CM | POA: Insufficient documentation

## 2014-10-21 NOTE — Progress Notes (Signed)
Echocardiogram 2D Echocardiogram has been performed.  Brandy Stevens 10/21/2014, 10:32 AM

## 2014-10-22 ENCOUNTER — Telehealth: Payer: Self-pay | Admitting: Family

## 2014-10-22 DIAGNOSIS — I422 Other hypertrophic cardiomyopathy: Secondary | ICD-10-CM

## 2014-10-22 NOTE — Telephone Encounter (Signed)
Please let pt know that her echo shows enlarged heart.  I would like her to meet with cardiology for further evaluation.

## 2014-10-23 NOTE — Telephone Encounter (Signed)
Unable to leave voicemail. Mailbox full

## 2014-10-27 NOTE — Telephone Encounter (Signed)
Left message for pt to return my call.

## 2014-10-28 ENCOUNTER — Telehealth: Payer: Self-pay

## 2014-10-28 NOTE — Telephone Encounter (Signed)
Pt returning your call best #[phone]

## 2014-10-28 NOTE — Telephone Encounter (Signed)
Notiifed pt and she is agreeable to proceed with referral.

## 2014-10-28 NOTE — Telephone Encounter (Signed)
Pre visit call completed 

## 2014-10-30 ENCOUNTER — Encounter: Payer: Self-pay | Admitting: Family

## 2014-10-30 ENCOUNTER — Ambulatory Visit (INDEPENDENT_AMBULATORY_CARE_PROVIDER_SITE_OTHER): Payer: Federal, State, Local not specified - PPO | Admitting: Family

## 2014-10-30 VITALS — BP 176/90 | HR 94 | Temp 97.8°F | Resp 16 | Ht 63.0 in | Wt 157.2 lb

## 2014-10-30 DIAGNOSIS — Z0000 Encounter for general adult medical examination without abnormal findings: Secondary | ICD-10-CM | POA: Diagnosis not present

## 2014-10-30 DIAGNOSIS — Z1239 Encounter for other screening for malignant neoplasm of breast: Secondary | ICD-10-CM

## 2014-10-30 NOTE — Patient Instructions (Addendum)
Complete lab work prior to leaving.  Restart losartan and hctz. Add regular exercise such as walking.  Follow up in 2 weeks. Schedule mammogram on the first floor in imaging.

## 2014-10-30 NOTE — Progress Notes (Signed)
Subjective:    Brandy Stevens is a 79 y.o. female who presents for Medicare Annual/Subsequent preventive examination.  Preventive Screening-Counseling & Management  Tobacco History  Smoking status  . Never Smoker   Smokeless tobacco  . Never Used     Problems Prior to Visit  1. Anemia- maintained on iron 325mg  once daily- uses colace pern.    2.  HTN- previously on amlodipine, hctz, losartan.  Reports that once her amlodipine manufacturer was changed she developed dizziness and stopped all meds back in February. Manual repeat BP check 188/90.   BP Readings from Last 3 Encounters:  10/30/14 176/90  09/22/14 136/78  07/15/14 122/72    Brings readings from home for the last week.  125/58, 119/55, 124/56  3.  Osteopenia- on caltrate.    4.  Hyperlipidemia- on fish oil  Lab Results  Component Value Date   CHOL 191 07/15/2014   HDL 61.90 07/15/2014   LDLCALC 115* 07/15/2014   TRIG 72.0 07/15/2014   CHOLHDL 3 07/15/2014    5.  Vit D def. - on vit D.    6.  Patient presents today for complete physical.  Immunizations:declines pneumonia and shingles vaccine. Tetanus is up to date Diet:  Reports healthy diet Exercise: no, needs to start Colonoscopy:3/11 Dexa: 2015 Pap Smear: NA Mammogram: up to date    Current Problems (verified) Patient Active Problem List   Diagnosis Date Noted  . Anemia, iron deficiency 07/15/2014  . Hyperlipemia 01/07/2014  . VITAMIN D DEFICIENCY 04/01/2010  . OSTEOPENIA 03/30/2010  . HEART MURMUR, SYSTOLIC 10/05/2009  . ANEMIA-NOS 08/25/2009  . Essential hypertension 08/25/2009  . ALLERGIC RHINITIS 08/25/2009    Medications Prior to Visit Current Outpatient Prescriptions on File Prior to Visit  Medication Sig Dispense Refill  . aspirin EC 81 MG tablet Take 81 mg by mouth daily.      . Calcium Carbonate-Vitamin D (CALTRATE 600+D) 600-400 MG-UNIT per tablet Take 1 tablet by mouth 2 (two) times daily.    . cholecalciferol (VITAMIN D)  1000 UNITS tablet Take 2,000 Units by mouth daily.    Marland Kitchen. docusate sodium (COLACE) 100 MG capsule Take 1 capsule by mouth 1 to 2 times daily as needed.    . ferrous sulfate (FERROUSUL) 325 (65 FE) MG tablet Take 325 mg by mouth daily with breakfast.     . Omega-3 Fatty Acids (FISH OIL) 1000 MG CAPS Take 1 capsule by mouth daily. Currently takes it once a week in the summer.    Marland Kitchen. amLODipine (NORVASC) 10 MG tablet Take 1 tablet (10 mg total) by mouth daily. (Patient not taking: Reported on 09/22/2014) 90 tablet 1  . hydrochlorothiazide (MICROZIDE) 12.5 MG capsule Take 1 capsule (12.5 mg total) by mouth daily. (Patient not taking: Reported on 09/22/2014) 90 capsule 1  . losartan (COZAAR) 50 MG tablet Take 1 tablet (50 mg total) by mouth daily. (Patient not taking: Reported on 09/22/2014) 90 tablet 1  . [DISCONTINUED] lisinopril (PRINIVIL,ZESTRIL) 10 MG tablet Take 1 tablet (10 mg total) by mouth daily. 30 tablet 3   No current facility-administered medications on file prior to visit.    Current Medications (verified) Current Outpatient Prescriptions  Medication Sig Dispense Refill  . aspirin EC 81 MG tablet Take 81 mg by mouth daily.      . Calcium Carbonate-Vitamin D (CALTRATE 600+D) 600-400 MG-UNIT per tablet Take 1 tablet by mouth 2 (two) times daily.    . cholecalciferol (VITAMIN D) 1000 UNITS tablet Take 2,000  Units by mouth daily.    Marland Kitchen docusate sodium (COLACE) 100 MG capsule Take 1 capsule by mouth 1 to 2 times daily as needed.    . ferrous sulfate (FERROUSUL) 325 (65 FE) MG tablet Take 325 mg by mouth daily with breakfast.     . Omega-3 Fatty Acids (FISH OIL) 1000 MG CAPS Take 1 capsule by mouth daily. Currently takes it once a week in the summer.    Marland Kitchen amLODipine (NORVASC) 10 MG tablet Take 1 tablet (10 mg total) by mouth daily. (Patient not taking: Reported on 09/22/2014) 90 tablet 1  . hydrochlorothiazide (MICROZIDE) 12.5 MG capsule Take 1 capsule (12.5 mg total) by mouth daily. (Patient not  taking: Reported on 09/22/2014) 90 capsule 1  . losartan (COZAAR) 50 MG tablet Take 1 tablet (50 mg total) by mouth daily. (Patient not taking: Reported on 09/22/2014) 90 tablet 1  . [DISCONTINUED] lisinopril (PRINIVIL,ZESTRIL) 10 MG tablet Take 1 tablet (10 mg total) by mouth daily. 30 tablet 3   No current facility-administered medications for this visit.     Allergies (verified) Ace inhibitors   PAST HISTORY  Family History History reviewed. No pertinent family history.  Social History History  Substance Use Topics  . Smoking status: Never Smoker   . Smokeless tobacco: Never Used  . Alcohol Use: No     Are there smokers in your home (other than you)? No  Risk Factors Current exercise habits: no exercise currently  Dietary issues discussed: Continue healthy diet   Cardiac risk factors: advanced age (older than 35 for men, 65 for women), dyslipidemia, hypertension and sedentary lifestyle.  Depression Screen (Note: if answer to either of the following is "Yes", a more complete depression screening is indicated)   Over the past two weeks, have you felt down, depressed or hopeless? No  Over the past two weeks, have you felt little interest or pleasure in doing things? No  Have you lost interest or pleasure in daily life? No  Do you often feel hopeless? No  Do you cry easily over simple problems? No  Activities of Daily Living In your present state of health, do you have any difficulty performing the following activities?:  Driving? No Managing money?  No Feeding yourself? No Getting from bed to chair? No. Climbing a flight of stairs? No Preparing food and eating?: No Bathing or showering? No Getting dressed: No Getting to the toilet? No Using the toilet:No Moving around from place to place: No In the past year have you fallen or had a near fall?:No   Are you sexually active?  No  Do you have more than one partner?  No  Hearing Difficulties: No Do you often ask  people to speak up or repeat themselves? No Do you experience ringing or noises in your ears? rarely Do you have difficulty understanding soft or whispered voices? somethimes   Do you feel that you have a problem with memory? No  Do you often misplace items? No  Do you feel safe at home?  Yes  Cognitive Testing  Alert? Yes  Normal Appearance?Yes  Oriented to person? Yes  Place? Yes   Time? Yes  Recall of three objects?  Yes  Can perform simple calculations? Yes  Displays appropriate judgment?Yes  Can read the correct time from a watch face?Yes   Advanced Directives have been discussed with the patient? Yes  List the Names of Other Physician/Practitioners you currently use: 1.    Indicate any recent Medical Services  you may have received from other than [facility] providers in the past year (date may be approximate).  Immunization History  Administered Date(s) Administered  . Influenza Split 05/23/2011  . Influenza Whole 04/14/2009  . Influenza, High Dose Seasonal PF 04/09/2013  . Td 08/25/2009    Screening Tests Health Maintenance  Topic Date Due  . ZOSTAVAX  10/28/2015 (Originally 07/28/1997)  . PNA vac Low Risk Adult (1 of 2 - PCV13) 10/28/2015 (Originally 07/28/2002)  . INFLUENZA VACCINE  01/25/2015  . TETANUS/TDAP  08/26/2019  . COLONOSCOPY  09/10/2019  . DEXA SCAN  Completed    All answers were reviewed with the patient and necessary referrals were made:  O'SULLIVAN,Genea Rheaume S., NP   10/30/2014   History reviewed: allergies, current medications, past family history, past medical history, past social history, past surgical history and problem list  Review of Systems .    Review of Systems  Constitutional: Negative for unexpected weight change.  HENT: Negative for hearing loss and rhinorrhea.   Eyes: Negative for visual disturbance. Uses reading glasses Respiratory: Negative for cough.   Cardiovascular: Negative for chest pain.  Mild leg swelling.  Gastrointestinal:  Negative for nausea, vomiting, diarrhea and blood in stool.  Genitourinary: Negative for dysuria and frequency.  Musculoskeletal: Negative for myalgias and arthralgias.  Skin: Negative for rash.  Neurological: Negative for headaches.  Hematological: Negative for adenopathy.  Psychiatric/Behavioral: Denies depression or anxiety     Objective:     Vision by Snellen chart: right eye: 20/40 left eye: 20/50 Body mass index is 27.85 kg/(m^2). BP 176/90 mmHg  Pulse 94  Temp(Src) 97.8 F (36.6 C) (Oral)  Resp 16  Ht  (1.6 m)  Wt 157 lb 3.2 oz (71.305 kg)  BMI 27.85 kg/m2  SpO2 99%  Physical Exam  Constitutional: She is oriented to person, place, and time. She appears well-developed and well-nourished. No distress.  HENT:  Head: Normocephalic and atraumatic.  Right Ear: Tympanic membrane and ear canal normal.  Left Ear: Tympanic membrane and ear canal normal.  Mouth/Throat: Oropharynx is clear and moist.  Eyes: Pupils are equal, round, and reactive to light. No scleral icterus.  Neck: Normal range of motion. No thyromegaly present.  Cardiovascular: Normal rate and regular rhythm.   + grade 2 systolic murmur heard. 1+ bilateral LE edema Pulmonary/Chest: Effort normal and breath sounds normal. No respiratory distress. He has no wheezes. She has no rales. She exhibits no tenderness.  Abdominal: Soft. Bowel sounds are normal. He exhibits no distension and no mass. There is no tenderness. There is no rebound and no guarding.  Musculoskeletal: She exhibits no edema.  Lymphadenopathy:    She has no cervical adenopathy.  Neurological: She is alert and oriented to person, place, and time.  She exhibits normal muscle tone. Coordination normal.  Skin: Skin is warm and dry.  Psychiatric: She has a normal mood and affect. Her behavior is normal. Judgment and thought content normal.  Breasts: Examined lying Right: Without masses, retractions, discharge or axillary adenopathy.  Left:  Without masses, retractions, discharge or axillary adenopathy.  Inguinal/mons: Normal without inguinal adenopathy         Assessment & Plan:        Assessment:          Plan:     During the course of the visit the patient was educated and counseled about appropriate screening and preventive services including:    Pneumococcal vaccine   Screening mammography  Advanced directives: pt given  copy of MOST form and HCPOA form to review  Diet review for nutrition referral? Yes ____  Not Indicated _x___   Patient Instructions (the written plan) was given to the patient.  Medicare Attestation I have personally reviewed: The patient's medical and social history Their use of alcohol, tobacco or illicit drugs Their current medications and supplements The patient's functional ability including ADLs,fall risks, home safety risks, cognitive, and hearing and visual impairment Diet and physical activities Evidence for depression or mood disorders  The patient's weight, height, BMI, and visual acuity have been recorded in the chart.  I have made referrals, counseling, and provided education to the patient based on review of the above and I have provided the patient with a written personalized care plan for preventive services.     O'SULLIVAN,Clois Montavon S., NP   10/30/2014

## 2014-10-30 NOTE — Assessment & Plan Note (Signed)
Discussed healthy diet, exercise.  Declines zostavax and pneumonia vaccine. Labs reviewed and up to date.  dexa up to date, colo up to date, refer for mammogram.

## 2014-11-13 ENCOUNTER — Ambulatory Visit: Payer: Federal, State, Local not specified - PPO | Admitting: Family

## 2014-11-15 ENCOUNTER — Encounter (HOSPITAL_BASED_OUTPATIENT_CLINIC_OR_DEPARTMENT_OTHER): Payer: Self-pay | Admitting: Family Medicine

## 2014-11-15 ENCOUNTER — Emergency Department (HOSPITAL_BASED_OUTPATIENT_CLINIC_OR_DEPARTMENT_OTHER): Payer: Federal, State, Local not specified - PPO

## 2014-11-15 ENCOUNTER — Emergency Department (HOSPITAL_BASED_OUTPATIENT_CLINIC_OR_DEPARTMENT_OTHER)
Admission: EM | Admit: 2014-11-15 | Discharge: 2014-11-15 | Disposition: A | Discharge status: Home / Self Care | Payer: Federal, State, Local not specified - PPO | Attending: Emergency Medicine | Admitting: Emergency Medicine

## 2014-11-15 DIAGNOSIS — I10 Essential (primary) hypertension: Secondary | ICD-10-CM | POA: Diagnosis not present

## 2014-11-15 DIAGNOSIS — Z862 Personal history of diseases of the blood and blood-forming organs and certain disorders involving the immune mechanism: Secondary | ICD-10-CM | POA: Diagnosis not present

## 2014-11-15 DIAGNOSIS — R42 Dizziness and giddiness: Secondary | ICD-10-CM | POA: Insufficient documentation

## 2014-11-15 DIAGNOSIS — Z8619 Personal history of other infectious and parasitic diseases: Secondary | ICD-10-CM | POA: Insufficient documentation

## 2014-11-15 DIAGNOSIS — R011 Cardiac murmur, unspecified: Secondary | ICD-10-CM | POA: Diagnosis not present

## 2014-11-15 DIAGNOSIS — Z7982 Long term (current) use of aspirin: Secondary | ICD-10-CM | POA: Insufficient documentation

## 2014-11-15 DIAGNOSIS — R110 Nausea: Secondary | ICD-10-CM | POA: Diagnosis not present

## 2014-11-15 DIAGNOSIS — Z79899 Other long term (current) drug therapy: Secondary | ICD-10-CM | POA: Diagnosis not present

## 2014-11-15 LAB — CBC WITH DIFFERENTIAL/PLATELET
Basophils Absolute: 0 10*3/uL (ref 0.0–0.1)
Basophils Relative: 0 % (ref 0–1)
Eosinophils Absolute: 0 10*3/uL (ref 0.0–0.7)
Eosinophils Relative: 0 % (ref 0–5)
HCT: 37.2 % (ref 36.0–46.0)
Hemoglobin: 11.6 g/dL — ABNORMAL LOW (ref 12.0–15.0)
Lymphocytes Relative: 14 % (ref 12–46)
Lymphs Abs: 0.8 10*3/uL (ref 0.7–4.0)
MCH: 27 pg (ref 26.0–34.0)
MCHC: 31.2 g/dL (ref 30.0–36.0)
MCV: 86.7 fL (ref 78.0–100.0)
Monocytes Absolute: 0.2 10*3/uL (ref 0.1–1.0)
Monocytes Relative: 4 % (ref 3–12)
Neutro Abs: 4.6 10*3/uL (ref 1.7–7.7)
Neutrophils Relative %: 82 % — ABNORMAL HIGH (ref 43–77)
Platelets: 214 10*3/uL (ref 150–400)
RBC: 4.29 MIL/uL (ref 3.87–5.11)
RDW: 14.2 % (ref 11.5–15.5)
WBC: 5.7 10*3/uL (ref 4.0–10.5)

## 2014-11-15 LAB — BASIC METABOLIC PANEL
Anion gap: 8 (ref 5–15)
BUN: 16 mg/dL (ref 6–20)
CO2: 29 mmol/L (ref 22–32)
Calcium: 9.1 mg/dL (ref 8.9–10.3)
Chloride: 104 mmol/L (ref 101–111)
Creatinine, Ser: 0.81 mg/dL (ref 0.44–1.00)
GFR calc Af Amer: >60 mL/min (ref >60)
GFR calc non Af Amer: >60 mL/min (ref >60)
Glucose, Bld: 135 mg/dL — ABNORMAL HIGH (ref 65–99)
Potassium: 3.7 mmol/L (ref 3.5–5.1)
Sodium: 141 mmol/L (ref 135–145)

## 2014-11-15 LAB — CBG MONITORING, ED: Glucose-Capillary: 118 mg/dL — ABNORMAL HIGH (ref 65–99)

## 2014-11-15 MED ORDER — DIAZEPAM 2 MG PO TABS: 2.0000 mg | ORAL_TABLET | Freq: Every evening | ORAL | Status: DC | PRN

## 2014-11-15 MED ORDER — ONDANSETRON HCL 4 MG/2ML IJ SOLN
4.0000 mg | Freq: Once | INTRAMUSCULAR | Status: AC
  Administered 2014-11-15: 4 mg via INTRAVENOUS
  Filled 2014-11-15: qty 2

## 2014-11-15 MED ORDER — MECLIZINE HCL 25 MG PO TABS
25.0000 mg | ORAL_TABLET | Freq: Once | ORAL | Status: AC
  Administered 2014-11-15: 25 mg via ORAL
  Filled 2014-11-15: qty 1

## 2014-11-15 MED ORDER — MECLIZINE HCL 25 MG PO TABS: ORAL_TABLET | ORAL | Status: DC

## 2014-11-15 NOTE — ED Notes (Signed)
Began having dizziness approx 0930hrs this am.

## 2014-11-15 NOTE — ED Notes (Signed)
Pt placed on cont cardiac monitoring with cont POX 

## 2014-11-15 NOTE — ED Notes (Signed)
FAST assessemnt: negative

## 2014-11-15 NOTE — ED Notes (Signed)
Pt c/o feeling dizzy and nauseated since 915. She also woke up feeling congested. States room is spinning. Has felt this way in past but many years ago. No facial asymmetry, no pronator drift.

## 2014-11-15 NOTE — ED Provider Notes (Signed)
CSN: [id number]     Arrival date & time 11/15/14  1112 History   First MD Initiated Contact with Patient 11/15/14 1148     Chief Complaint  Patient presents with  . Dizziness      HPI  Patient % relation or dizziness. States that approximately 7:15 this morning she sat up and felt dizziness. She laid back down. About 9:15 she was getting up. Was standing on her vanity preparing to go to church. She turned to walk she felt dizzy like the room was spinning. Similar episode many years ago that she remembers being told was "vertigo". No headache. No head injury. No numbness weakness tingling or stroke symptoms. No difficulty with swallowing speech or confusion.  Past Medical History  Diagnosis Date  . History of chicken pox   . UTI (lower urinary tract infection)   . Allergy     allergic rhinitis  . Anemia     nos  . Hypertension   . Systolic murmur     Normal valves on 2-d echo 5/11   Past Surgical History  Procedure Laterality Date  . Right oophorectomy  1968   No family history on file. History  Substance Use Topics  . Smoking status: Never Smoker   . Smokeless tobacco: Never Used  . Alcohol Use: No   OB History    No data available     Review of Systems  Constitutional: Negative for fever, chills, diaphoresis, appetite change and fatigue.  HENT: Negative for mouth sores, sore throat and trouble swallowing.   Eyes: Negative for visual disturbance.  Respiratory: Negative for cough, chest tightness, shortness of breath and wheezing.   Cardiovascular: Negative for chest pain.  Gastrointestinal: Positive for nausea. Negative for vomiting, abdominal pain, diarrhea and abdominal distention.  Endocrine: Negative for polydipsia, polyphagia and polyuria.  Genitourinary: Negative for dysuria, frequency and hematuria.  Musculoskeletal: Negative for gait problem.  Skin: Negative for color change, pallor and rash.  Neurological: Positive for dizziness. Negative for syncope,  light-headedness and headaches.  Hematological: Does not bruise/bleed easily.  Psychiatric/Behavioral: Negative for behavioral problems and confusion.      Allergies  Ace inhibitors  Home Medications   Prior to Admission medications   Medication Sig Start Date End Date Taking? Authorizing Provider  aspirin EC 81 MG tablet Take 81 mg by mouth daily.      Historical Provider, MD  Calcium Carbonate-Vitamin D (CALTRATE 600+D) 600-400 MG-UNIT per tablet Take 1 tablet by mouth 2 (two) times daily.    Historical Provider, MD  cholecalciferol (VITAMIN D) 1000 UNITS tablet Take 2,000 Units by mouth daily. 01/09/11   Sandford Craze, NP  diazepam (VALIUM) 2 MG tablet Take 1 tablet (2 mg total) by mouth at bedtime as needed (vertigo at bedtime). 11/15/14   Rolland Porter, MD  docusate sodium (COLACE) 100 MG capsule Take 1 capsule by mouth 1 to 2 times daily as needed.    Historical Provider, MD  ferrous sulfate (FERROUSUL) 325 (65 FE) MG tablet Take 325 mg by mouth daily with breakfast.     Historical Provider, MD  hydrochlorothiazide (MICROZIDE) 12.5 MG capsule Take 1 capsule (12.5 mg total) by mouth daily. Patient not taking: Reported on 09/22/2014 07/15/14   Sandford Craze, NP  losartan (COZAAR) 50 MG tablet Take 1 tablet (50 mg total) by mouth daily. Patient not taking: Reported on 09/22/2014 07/15/14   Sandford Craze, NP  meclizine (ANTIVERT) 25 MG tablet Take until 24 hours without dizziness 11/15/14  Rolland PorterMark Vicky Mccanless, MD  Omega-3 Fatty Acids (FISH OIL) 1000 MG CAPS Take 1 capsule by mouth daily. Currently takes it once a week in the summer.    Historical Provider, MD   BP 175/59 mmHg  Pulse 76  Temp(Src) 98.1 F (36.7 C) (Oral)  Resp 18  Ht 5\' 3"  (1.6 m)  Wt 157 lb (71.215 kg)  BMI 27.82 kg/m2  SpO2 95% Physical Exam  Constitutional: She is oriented to person, place, and time. She appears well-developed and well-nourished. No distress.  HENT:  Head: Normocephalic.  Eyes: Conjunctivae  are normal. Pupils are equal, round, and reactive to light. No scleral icterus.  Neck: Normal range of motion. Neck supple. No thyromegaly present.  Cardiovascular: Normal rate and regular rhythm.  Exam reveals no gallop and no friction rub.   No murmur heard. Pulmonary/Chest: Effort normal and breath sounds normal. No respiratory distress. She has no wheezes. She has no rales.  Abdominal: Soft. Bowel sounds are normal. She exhibits no distension. There is no tenderness. There is no rebound.  Musculoskeletal: Normal range of motion.  Neurological: She is alert and oriented to person, place, and time.  Nystagmus to lateral gaze. To go from laying to sitting she said reports has been sensation. Does not have orthostatic blood pressure changes.  Skin: Skin is warm and dry. No rash noted.  Psychiatric: She has a normal mood and affect. Her behavior is normal.    ED Course  Procedures (including critical care time) Labs Review Labs Reviewed  CBC WITH DIFFERENTIAL/PLATELET - Abnormal; Notable for the following:    Hemoglobin 11.6 (*)    Neutrophils Relative % 82 (*)    All other components within normal limits  BASIC METABOLIC PANEL - Abnormal; Notable for the following:    Glucose, Bld 135 (*)    All other components within normal limits  CBG MONITORING, ED - Abnormal; Notable for the following:    Glucose-Capillary 118 (*)    All other components within normal limits    Imaging Review Ct Head Wo Contrast  11/15/2014   CLINICAL DATA:  Dizziness, nausea  EXAM: CT HEAD WITHOUT CONTRAST  TECHNIQUE: Contiguous axial images were obtained from the base of the skull through the vertex without intravenous contrast.  COMPARISON:  None.  FINDINGS: No evidence of parenchymal hemorrhage or extra-axial fluid collection. No mass lesion, mass effect, or midline shift.  No CT evidence of acute infarction.  Subcortical white matter and periventricular small vessel ischemic changes. Intracranial  atherosclerosis.  Cerebral volume is within normal limits.  No ventriculomegaly.  The visualized paranasal sinuses are essentially clear. The mastoid air cells are unopacified.  No evidence of calvarial fracture.  IMPRESSION: No evidence of acute intracranial abnormality.  Small vessel ischemic changes.   Electronically Signed   By: Charline BillsSriyesh  Krishnan M.D.   On: 11/15/2014 12:49     EKG Interpretation None      MDM   Final diagnoses:  Vertigo    Patient rechecked. Her symptoms are improved. She is ambulatory in the room with minimal symptoms. No acute findings on CT. She is desirous of discharge home. I think this is appropriate. Discharge home. Antivert until 24 hours without symptoms. When necessary Valium at night. Primary care follow-up. ER with acute changes.    Rolland PorterMark Shirlette Scarber, MD 11/15/14 1341

## 2014-11-15 NOTE — ED Notes (Signed)
Patient transported to CT via stretcher, sr x 2 up  

## 2014-11-15 NOTE — Discharge Instructions (Signed)
Vertigo Vertigo means you feel like you are moving when you are not. Vertigo can make you feel like things around you are moving when they are not. This problem often goes away on its own.  HOME CARE   Follow your doctor's instructions.  Avoid driving.  Avoid using heavy machinery.  Avoid doing any activity that could be dangerous if you have a vertigo attack.  Tell your doctor if a medicine seems to cause your vertigo. GET HELP RIGHT AWAY IF:   Your medicines do not help or make you feel worse.  You have trouble talking or walking.  You feel weak or have trouble using your arms, hands, or legs.  You have bad headaches.  You keep feeling sick to your stomach (nauseous) or throwing up (vomiting).  Your vision changes.  A family member notices changes in your behavior.  Your problems get worse. MAKE SURE YOU:  Understand these instructions.  Will watch your condition.  Will get help right away if you are not doing well or get worse. Document Released: 03/21/2008 Document Revised: 09/04/2011 Document Reviewed: 12/29/2010 Exit[employer] Patient Information 2015 [employer]. This information is not intended to replace advice given to you by your health care provider. Make sure you discuss any questions you have with your health care provider.  

## 2014-11-15 NOTE — ED Notes (Signed)
MD at bedside. 

## 2014-11-15 NOTE — ED Notes (Signed)
Family at bedside. 

## 2014-11-19 ENCOUNTER — Telehealth: Payer: Self-pay | Admitting: Family

## 2014-11-19 NOTE — Telephone Encounter (Signed)
Pt went to the ED for vertigo 11/15/14 and advised the ED attending physician that pt has an appointment already scheduled for 11/20/14. ED physician advised pt to keep appointment. Pt wanted to make you aware

## 2014-11-20 ENCOUNTER — Encounter: Payer: Self-pay | Admitting: Family

## 2014-11-20 ENCOUNTER — Ambulatory Visit (INDEPENDENT_AMBULATORY_CARE_PROVIDER_SITE_OTHER): Payer: Federal, State, Local not specified - PPO | Admitting: Family

## 2014-11-20 VITALS — BP 170/95 | HR 79 | Temp 97.8°F | Resp 16 | Ht 63.0 in | Wt 159.2 lb

## 2014-11-20 DIAGNOSIS — I10 Essential (primary) hypertension: Secondary | ICD-10-CM | POA: Diagnosis not present

## 2014-11-20 NOTE — Progress Notes (Signed)
Subjective:    Patient ID: Brandy Stevens, female    DOB: 06-25-1936, 79 y.o.   MRN: [id number]  HPI  Patient is currently maintained on the following medications for blood pressure: (Losartan and hctz) these were restarted last visit.   Patient reports good compliance with blood pressure medications. Patient denies chest pain, shortness of breath. Does have some chronic LE edema which is at baseline Last 3 blood pressure readings in our office are as follows: BP Readings from Last 3 Encounters:  11/20/14 170/95  11/15/14 158/74  10/30/14 176/90   She reports that her BP reading at home in the last week 109-137/68-75.  She reports that when she bends forward she gets a little dizzy. Was evaluated in the ED on 5/22 with vertigo- reviewed note, CT head negative. Was given rx for meclizine.    Review of Systems See HPI  Past Medical History  Diagnosis Date  . History of chicken pox   . UTI (lower urinary tract infection)   . Allergy     allergic rhinitis  . Anemia     nos  . Hypertension   . Systolic murmur     Normal valves on 2-d echo 5/11    History   Social History  . Marital Status: Widowed    Spouse Name: N/A  . Number of Children: 1  . Years of Education: N/A   Occupational History  . NURSE    Social History Main Topics  . Smoking status: Never Smoker   . Smokeless tobacco: Never Used  . Alcohol Use: No  . Drug Use: No  . Sexual Activity: Not on file   Other Topics Concern  . Not on file   Social History Narrative   7 brothers-- 1 died from unknown cause, 1 died from pneumonia. 5 still living.   1 son a & w          Past Surgical History  Procedure Laterality Date  . Right oophorectomy  1968    No family history on file.  Allergies  Allergen Reactions  . Ace Inhibitors     cough    Current Outpatient Prescriptions on File Prior to Visit  Medication Sig Dispense Refill  . aspirin EC 81 MG tablet Take 81 mg by mouth daily.      .  Calcium Carbonate-Vitamin D (CALTRATE 600+D) 600-400 MG-UNIT per tablet Take 1 tablet by mouth 2 (two) times daily.    . cholecalciferol (VITAMIN D) 1000 UNITS tablet Take 2,000 Units by mouth daily.    Marland Kitchen docusate sodium (COLACE) 100 MG capsule Take 1 capsule by mouth 1 to 2 times daily as needed.    . ferrous sulfate (FERROUSUL) 325 (65 FE) MG tablet Take 325 mg by mouth daily with breakfast.     . hydrochlorothiazide (MICROZIDE) 12.5 MG capsule Take 1 capsule (12.5 mg total) by mouth daily. 90 capsule 1  . losartan (COZAAR) 50 MG tablet Take 1 tablet (50 mg total) by mouth daily. 90 tablet 1  . Omega-3 Fatty Acids (FISH OIL) 1000 MG CAPS Take 1 capsule by mouth daily. Currently takes it once a week in the summer.    . [DISCONTINUED] lisinopril (PRINIVIL,ZESTRIL) 10 MG tablet Take 1 tablet (10 mg total) by mouth daily. 30 tablet 3   No current facility-administered medications on file prior to visit.    BP 170/95 mmHg  Pulse 79  Temp(Src) 97.8 F (36.6 C) (Oral)  Resp 16  Ht  (  1.6 m)  Wt 159 lb 3.2 oz (72.213 kg)  BMI 28.21 kg/m2  SpO2 98%       Objective:   Physical Exam  Constitutional: She is oriented to person, place, and time. She appears well-developed and well-nourished.  Cardiovascular: Normal rate, regular rhythm and normal heart sounds.   No murmur heard. 2+ bilateral pedal edema noted  Pulmonary/Chest: Effort normal and breath sounds normal. No respiratory distress. She has no wheezes.  Neurological: She is alert and oriented to person, place, and time.  Psychiatric: She has a normal mood and affect. Her behavior is normal. Judgment and thought content normal.          Assessment & Plan:

## 2014-11-20 NOTE — Progress Notes (Signed)
Pre visit review using our clinic review tool, if applicable. No additional management support is needed unless otherwise documented below in the visit note. 

## 2014-11-20 NOTE — Patient Instructions (Signed)
Take an additional losartan tab now. In AM check blood pressure.  If Systolic pressure >110, continue losartan 100 mg once daily.  If <110, take 50 mg of losartan. Record readings. Follow up in 1 week for blood pressure recheck and blood work.

## 2014-11-20 NOTE — Assessment & Plan Note (Signed)
Either she has significant white coat HTN or her home BP monitor is inaccurate. Advised pt:  Take an additional losartan tab now. In AM check blood pressure.  If Systolic pressure >110, continue losartan 100 mg once daily.  If <110, take 50 mg of losartan. Record readings. Follow up in 1 week for blood pressure recheck and blood work. I have asked her to bring her monitor next visit to check.

## 2014-12-01 ENCOUNTER — Encounter: Payer: Self-pay | Admitting: Family

## 2014-12-01 ENCOUNTER — Ambulatory Visit (INDEPENDENT_AMBULATORY_CARE_PROVIDER_SITE_OTHER): Payer: Federal, State, Local not specified - PPO | Admitting: Family

## 2014-12-01 VITALS — BP 182/62 | HR 73 | Temp 97.9°F | Ht 63.0 in | Wt 160.2 lb

## 2014-12-01 DIAGNOSIS — I10 Essential (primary) hypertension: Secondary | ICD-10-CM | POA: Diagnosis not present

## 2014-12-01 LAB — BASIC METABOLIC PANEL
BUN: 16 mg/dL (ref 6–23)
CO2: 33 mEq/L — ABNORMAL HIGH (ref 19–32)
Calcium: 9.4 mg/dL (ref 8.4–10.5)
Chloride: 105 mEq/L (ref 96–112)
Creatinine, Ser: 0.86 mg/dL (ref 0.40–1.20)
GFR: 82.21 mL/min (ref >60.00)
GLUCOSE: 94 mg/dL (ref 70–99)
Potassium: 4.1 mEq/L (ref 3.5–5.1)
SODIUM: 142 meq/L (ref 135–145)

## 2014-12-01 MED ORDER — METOPROLOL SUCCINATE ER 25 MG PO TB24
25.0000 mg | ORAL_TABLET | Freq: Every day | ORAL | Status: DC
Start: 2014-12-01 — End: 2015-01-11

## 2014-12-01 NOTE — Progress Notes (Signed)
Subjective:    Patient ID: Brandy Stevens, female    DOB: 08/13/1935, 79 y.o.   MRN: [id number]  HPI   Ms. Brandy Stevens is a 79 yr old female who presents today for follow up of her blood pressure. She was seen 2 weeks ago and was noted to have elevated blood pressure.  Losartan was increased from 25mg  to 50mg  once daily. She is maintained on hctz 12.5 mg. Took AM meds today at 8:45 AM.  BP Readings from Last 3 Encounters:  12/01/14 182/62  11/20/14 170/95  11/15/14 158/74   Home BP readings for the last week have ranged 120-136/49-85.   Review of Systems See HPI  Past Medical History  Diagnosis Date  . History of chicken pox   . UTI (lower urinary tract infection)   . Allergy     allergic rhinitis  . Anemia     nos  . Hypertension   . Systolic murmur     Normal valves on 2-d echo 5/11    History   Social History  . Marital Status: Widowed    Spouse Name: N/A  . Number of Children: 1  . Years of Education: N/A   Occupational History  . NURSE    Social History Main Topics  . Smoking status: Never Smoker   . Smokeless tobacco: Never Used  . Alcohol Use: No  . Drug Use: No  . Sexual Activity: Not on file   Other Topics Concern  . Not on file   Social History Narrative   7 brothers-- 1 died from unknown cause, 1 died from pneumonia. 5 still living.   1 son a & w          Past Surgical History  Procedure Laterality Date  . Right oophorectomy  1968    No family history on file.  Allergies  Allergen Reactions  . Ace Inhibitors     cough    Current Outpatient Prescriptions on File Prior to Visit  Medication Sig Dispense Refill  . aspirin EC 81 MG tablet Take 81 mg by mouth daily.      . Calcium Carbonate-Vitamin D (CALTRATE 600+D) 600-400 MG-UNIT per tablet Take 1 tablet by mouth 2 (two) times daily.    . cholecalciferol (VITAMIN D) 1000 UNITS tablet Take 2,000 Units by mouth daily.    Marland Kitchen. docusate sodium (COLACE) 100 MG capsule Take 1 capsule by mouth 1 to  2 times daily as needed.    . ferrous sulfate (FERROUSUL) 325 (65 FE) MG tablet Take 325 mg by mouth daily with breakfast.     . hydrochlorothiazide (MICROZIDE) 12.5 MG capsule Take 1 capsule (12.5 mg total) by mouth daily. 90 capsule 1  . losartan (COZAAR) 50 MG tablet Take 1 tablet (50 mg total) by mouth daily. 90 tablet 1  . Omega-3 Fatty Acids (FISH OIL) 1000 MG CAPS Take 1 capsule by mouth daily. Currently takes it once a week in the summer.    . [DISCONTINUED] lisinopril (PRINIVIL,ZESTRIL) 10 MG tablet Take 1 tablet (10 mg total) by mouth daily. 30 tablet 3   No current facility-administered medications on file prior to visit.    BP 182/62 mmHg  Pulse 73  Temp(Src) 97.9 F (36.6 C) (Oral)  Ht 5\' 3"  (1.6 m)  Wt 160 lb 3.2 oz (72.666 kg)  BMI 28.39 kg/m2  SpO2 96%       Objective:   Physical Exam  Constitutional: She appears well-developed and well-nourished.  Cardiovascular: Normal  rate, regular rhythm and normal heart sounds.   No murmur heard. Pulmonary/Chest: Effort normal and breath sounds normal. No respiratory distress. She has no wheezes.  Psychiatric: She has a normal mood and affect. Her behavior is normal. Judgment and thought content normal.          Assessment & Plan:

## 2014-12-01 NOTE — Progress Notes (Signed)
Pre visit review using our clinic review tool, if applicable. No additional management support is needed unless otherwise documented below in the visit note. 

## 2014-12-01 NOTE — Patient Instructions (Signed)
Start Toprol XL 25mg  once a day. Call if blood pressure less than 110/60. Complete lab work today. Follow up in 2 weeks.

## 2014-12-03 NOTE — Assessment & Plan Note (Signed)
Uncontrolled.  Advised pt:  Start Toprol XL 25mg  once a day. Call if blood pressure less than 110/60. Complete lab work today. Follow up in 2 weeks.

## 2014-12-17 ENCOUNTER — Encounter: Payer: Self-pay | Admitting: Family

## 2014-12-17 ENCOUNTER — Ambulatory Visit (INDEPENDENT_AMBULATORY_CARE_PROVIDER_SITE_OTHER): Payer: Federal, State, Local not specified - PPO | Admitting: Family

## 2014-12-17 VITALS — BP 178/78 | HR 65 | Temp 98.2°F | Resp 16 | Ht 63.0 in | Wt 161.2 lb

## 2014-12-17 DIAGNOSIS — I10 Essential (primary) hypertension: Secondary | ICD-10-CM

## 2014-12-17 NOTE — Assessment & Plan Note (Signed)
Based on her home BP readings, I think she has some white coat htn, so I am hesitant to raise her BP meds as I fear her BP may run too low at home.  I have asked her to continue current meds and BP monitoring and to call me if she consistently runs >140/90 at home.  She verbalizes understanding.

## 2014-12-17 NOTE — Patient Instructions (Signed)
Please continue current meds and home blood pressure monitoring. Call if you see consistent BP >140/90. Follow up in 3 months.

## 2014-12-17 NOTE — Progress Notes (Signed)
Subjective:    Patient ID: Brandy Stevens, female    DOB: 1935/12/02, 79 y.o.   MRN: [id number]  HPI  167/73 R with pt's automated wrist cuff  She brings home BP readings with her.  Range 122-140/52-73  BP Readings from Last 3 Encounters:  12/17/14 178/78  12/01/14 182/62  11/20/14 170/95    Review of Systems     Past Medical History  Diagnosis Date  . History of chicken pox   . UTI (lower urinary tract infection)   . Allergy     allergic rhinitis  . Anemia     nos  . Hypertension   . Systolic murmur     Normal valves on 2-d echo 5/11    History   Social History  . Marital Status: Widowed    Spouse Name: N/A  . Number of Children: 1  . Years of Education: N/A   Occupational History  . NURSE    Social History Main Topics  . Smoking status: Never Smoker   . Smokeless tobacco: Never Used  . Alcohol Use: No  . Drug Use: No  . Sexual Activity: Not on file   Other Topics Concern  . Not on file   Social History Narrative   7 brothers-- 1 died from unknown cause, 1 died from pneumonia. 5 still living.   1 son a & w          Past Surgical History  Procedure Laterality Date  . Right oophorectomy  1968    No family history on file.  Allergies  Allergen Reactions  . Ace Inhibitors     cough    Current Outpatient Prescriptions on File Prior to Visit  Medication Sig Dispense Refill  . aspirin EC 81 MG tablet Take 81 mg by mouth daily.      . Calcium Carbonate-Vitamin D (CALTRATE 600+D) 600-400 MG-UNIT per tablet Take 1 tablet by mouth 2 (two) times daily.    . cholecalciferol (VITAMIN D) 1000 UNITS tablet Take 2,000 Units by mouth daily.    Marland Kitchen docusate sodium (COLACE) 100 MG capsule Take 1 capsule by mouth 1 to 2 times daily as needed.    . ferrous sulfate (FERROUSUL) 325 (65 FE) MG tablet Take 325 mg by mouth daily with breakfast.     . hydrochlorothiazide (MICROZIDE) 12.5 MG capsule Take 1 capsule (12.5 mg total) by mouth daily. 90 capsule 1  .  losartan (COZAAR) 50 MG tablet Take 1 tablet (50 mg total) by mouth daily. 90 tablet 1  . metoprolol succinate (TOPROL XL) 25 MG 24 hr tablet Take 1 tablet (25 mg total) by mouth daily. 30 tablet 0  . Omega-3 Fatty Acids (FISH OIL) 1000 MG CAPS Take 1 capsule by mouth daily. Currently takes it once a week in the summer.    . [DISCONTINUED] lisinopril (PRINIVIL,ZESTRIL) 10 MG tablet Take 1 tablet (10 mg total) by mouth daily. 30 tablet 3   No current facility-administered medications on file prior to visit.    BP 178/78 mmHg  Pulse 65  Temp(Src) 98.2 F (36.8 C) (Oral)  Resp 16  Ht  (1.6 m)  Wt 161 lb 3.2 oz (73.12 kg)  BMI 28.56 kg/m2  SpO2 97%    Objective:   Physical Exam  Constitutional: She appears well-developed and well-nourished.  HENT:  Head: Normocephalic and atraumatic.  Cardiovascular: Normal rate, regular rhythm and normal heart sounds.   No murmur heard. 2+ bilateral LE edema  Pulmonary/Chest: Effort  normal and breath sounds normal. No respiratory distress. She has no wheezes.  Skin: Skin is warm and dry.  Psychiatric: She has a normal mood and affect. Her behavior is normal. Judgment and thought content normal.          Assessment & Plan:

## 2014-12-17 NOTE — Progress Notes (Signed)
Pre visit review using our clinic review tool, if applicable. No additional management support is needed unless otherwise documented below in the visit note. 

## 2015-01-11 ENCOUNTER — Telehealth: Payer: Self-pay | Admitting: *Deleted

## 2015-01-11 ENCOUNTER — Ambulatory Visit (HOSPITAL_BASED_OUTPATIENT_CLINIC_OR_DEPARTMENT_OTHER)
Admission: RE | Admit: 2015-01-11 | Discharge: 2015-01-11 | Disposition: A | Discharge status: Home / Self Care | Payer: Federal, State, Local not specified - PPO | Source: Ambulatory Visit | Attending: Family | Admitting: Family

## 2015-01-11 DIAGNOSIS — Z1231 Encounter for screening mammogram for malignant neoplasm of breast: Secondary | ICD-10-CM | POA: Insufficient documentation

## 2015-01-11 DIAGNOSIS — Z1239 Encounter for other screening for malignant neoplasm of breast: Secondary | ICD-10-CM

## 2015-01-11 DIAGNOSIS — R928 Other abnormal and inconclusive findings on diagnostic imaging of breast: Secondary | ICD-10-CM | POA: Diagnosis not present

## 2015-01-11 MED ORDER — METOPROLOL SUCCINATE ER 25 MG PO TB24: 25.0000 mg | ORAL_TABLET | Freq: Every day | ORAL | Status: DC

## 2015-01-11 NOTE — Telephone Encounter (Signed)
Pt came in to the office. States she requested refill of metoprolol with pharmacy last week and they told her they haven't received it as of this morning.  I do not see eRx request or fax request but advised pt I will send refill now.

## 2015-01-13 ENCOUNTER — Other Ambulatory Visit: Payer: Self-pay | Admitting: Family

## 2015-01-13 DIAGNOSIS — R928 Other abnormal and inconclusive findings on diagnostic imaging of breast: Secondary | ICD-10-CM

## 2015-01-19 ENCOUNTER — Ambulatory Visit
Admission: RE | Admit: 2015-01-19 | Discharge: 2015-01-19 | Disposition: A | Discharge status: Home / Self Care | Payer: Federal, State, Local not specified - PPO | Source: Ambulatory Visit | Attending: Family | Admitting: Family

## 2015-01-19 DIAGNOSIS — R928 Other abnormal and inconclusive findings on diagnostic imaging of breast: Secondary | ICD-10-CM

## 2015-01-22 NOTE — Progress Notes (Signed)
HPI: 79 yo female for evaluation of HOCM. Echo 4/16 showed vigorous LV function, grade 1 diastolic dysfunction, SAM, trace MR, mild LAE, mild AI, narrow LVOT with focal basal septal hypertrophy, peak outflow gradient 17 mmHg, echo felt c/w HOCM. Patient denies dyspnea, chest pain, palpitations or syncope.   Current Outpatient Prescriptions  Medication Sig Dispense Refill  . aspirin EC 81 MG tablet Take 81 mg by mouth daily.      . Calcium Carbonate-Vitamin D (CALTRATE 600+D) 600-400 MG-UNIT per tablet Take 1 tablet by mouth 2 (two) times daily.    . cholecalciferol (VITAMIN D) 1000 UNITS tablet Take 2,000 Units by mouth daily.    Marland Kitchen docusate sodium (COLACE) 100 MG capsule Take 1 capsule by mouth 1 to 2 times daily as needed.    . ferrous sulfate (FERROUSUL) 325 (65 FE) MG tablet Take 325 mg by mouth daily with breakfast.     . hydrochlorothiazide (MICROZIDE) 12.5 MG capsule Take 1 capsule (12.5 mg total) by mouth daily. 90 capsule 1  . losartan (COZAAR) 50 MG tablet Take 1 tablet (50 mg total) by mouth daily. 90 tablet 1  . metoprolol succinate (TOPROL XL) 25 MG 24 hr tablet Take 1 tablet (25 mg total) by mouth daily. 30 tablet 2  . Omega-3 Fatty Acids (FISH OIL) 1000 MG CAPS Take 1 capsule by mouth daily. Currently takes it once a week in the summer.    . [DISCONTINUED] lisinopril (PRINIVIL,ZESTRIL) 10 MG tablet Take 1 tablet (10 mg total) by mouth daily. 30 tablet 3   No current facility-administered medications for this visit.    Allergies  Allergen Reactions  . Ace Inhibitors     cough     Past Medical History  Diagnosis Date  . History of chicken pox   . Allergy     allergic rhinitis  . Anemia     nos  . Hypertension   . Systolic murmur     Normal valves on 2-d echo 5/11    Past Surgical History  Procedure Laterality Date  . Right oophorectomy  1968    History   Social History  . Marital Status: Widowed    Spouse Name: N/A  . Number of Children: 1  . Years  of Education: N/A   Occupational History  . NURSE    Social History Main Topics  . Smoking status: Never Smoker   . Smokeless tobacco: Never Used  . Alcohol Use: No  . Drug Use: No  . Sexual Activity: Not on file   Other Topics Concern  . Not on file   Social History Narrative   7 brothers-- 1 died from unknown cause, 1 died from pneumonia. 5 still living.   1 son a & w          Family History  Problem Relation Age of Onset  . Pneumonia Father   . Other Mother     complications from hip fx    ROS: no fevers or chills, productive cough, hemoptysis, dysphasia, odynophagia, melena, hematochezia, dysuria, hematuria, rash, seizure activity, orthopnea, PND, pedal edema, claudication. Remaining systems are negative.  Physical Exam:   Blood pressure 152/98, pulse 88, height 5' 2.5" (1.588 m), weight 159 lb 3.2 oz (72.213 kg).  General:  Well developed/well nourished in NAD Skin warm/dry Patient not depressed No peripheral clubbing Back-normal HEENT-normal/normal eyelids Neck supple/normal carotid upstroke bilaterally; right carotid bruit; no JVD; no thyromegaly chest - CTA/ normal expansion CV - RRR/normal S1  and S2; no rubs or gallops;  PMI nondisplaced, 2/6 systolic murmur left sternal border. No change with Valsalva. Abdomen -NT/ND, no HSM, no mass, + bowel sounds, no bruit 2+ femoral pulses, no bruits Ext-no edema, chords, 2+ DP Neuro-grossly nonfocal  ECG 11/15/14 sinus with anterior and inferior TWI.

## 2015-01-25 ENCOUNTER — Ambulatory Visit (INDEPENDENT_AMBULATORY_CARE_PROVIDER_SITE_OTHER): Payer: Federal, State, Local not specified - PPO | Admitting: Cardiology

## 2015-01-25 ENCOUNTER — Encounter: Payer: Self-pay | Admitting: Cardiology

## 2015-01-25 ENCOUNTER — Encounter: Payer: Self-pay | Admitting: *Deleted

## 2015-01-25 VITALS — BP 152/98 | HR 88 | Ht 62.5 in | Wt 159.2 lb

## 2015-01-25 DIAGNOSIS — E785 Hyperlipidemia, unspecified: Secondary | ICD-10-CM | POA: Diagnosis not present

## 2015-01-25 DIAGNOSIS — R0989 Other specified symptoms and signs involving the circulatory and respiratory systems: Secondary | ICD-10-CM | POA: Diagnosis not present

## 2015-01-25 DIAGNOSIS — I421 Obstructive hypertrophic cardiomyopathy: Secondary | ICD-10-CM

## 2015-01-25 NOTE — Assessment & Plan Note (Signed)
Patient presents for evaluation of hypertrophic obstructive cardiomyopathy noted on echocardiogram. She is asymptomatic. Continue beta blocker. I will arrange an exercise treadmill to evaluate systolic blood pressure with exercise. Schedule 24-hour Holter monitor to exclude nonsustained ventricular tachycardia. No family history of sudden death and no history of syncope. Have asked her to have her family members screened.

## 2015-01-25 NOTE — Assessment & Plan Note (Signed)
Blood pressure mildly elevatedshe follows this at home and typically control. Continue present medications and follow.

## 2015-01-25 NOTE — Assessment & Plan Note (Signed)
Management per primary care. 

## 2015-01-25 NOTE — Assessment & Plan Note (Signed)
Patient presents for evaluation of hypertrophic obstructive cardiomyopathy noted on echocardiogram. She is asymptomatic. Continue beta blocker. I will arrange an exercise treadmill to evaluate systolic blood pressure with exercise. Schedule 24-hour Holter monitor to exclude nonsustained ventricular tachycardia. No family history of sudden death and no history of syncope. Have asked her to have her family members screened. 

## 2015-01-25 NOTE — Assessment & Plan Note (Signed)
Carotid Dopplers for right carotid bruit

## 2015-01-25 NOTE — Patient Instructions (Signed)
Your physician wants you to follow-up in: ONE YEAR WITH DR Shelda Pal will receive a reminder letter in the mail two months in advance. If you don't receive a letter, please call our office to schedule the follow-up appointment.   Your physician has requested that you have a carotid duplex. This test is an ultrasound of the carotid arteries in your neck. It looks at blood flow through these arteries that supply the brain with blood. Allow one hour for this exam. There are no restrictions or special instructions.   Your physician has recommended that you wear a 24 HOUR holter monitor. Holter monitors are medical devices that record the heart's electrical activity. Doctors most often use these monitors to diagnose arrhythmias. Arrhythmias are problems with the speed or rhythm of the heartbeat. The monitor is a small, portable device. You can wear one while you do your normal daily activities. This is usually used to diagnose what is causing palpitations/syncope (passing out).  SCHEDULE AT [facility]   Your physician has requested that you have an exercise tolerance test. For further information please visit [email or web address] Please also follow instruction sheet, as given.    Exercise Stress Electrocardiogram An exercise stress electrocardiogram is a test to check how blood flows to your heart. It is done to find areas of poor blood flow. You will need to walk on a treadmill for this test. The electrocardiogram will record your heartbeat when you are at rest and when you are exercising. BEFORE THE PROCEDURE  Do not have drinks with caffeine or foods with caffeine for 24 hours before the test, or as told by your doctor. This includes coffee, tea (even decaf tea), sodas, chocolate, and cocoa.  Follow your doctor's instructions about eating and drinking before the test.  Ask your doctor what medicines you should or should not take before the test. Take your medicines with water unless told by  your doctor not to.  If you use an inhaler, bring it with you to the test.  Bring a snack to eat after the test.  Do not  smoke for 4 hours before the test.  Do not put lotions, powders, creams, or oils on your chest before the test.  Wear comfortable shoes and clothing. PROCEDURE  You will have patches put on your chest. Small areas of your chest may need to be shaved. Wires will be connected to the patches.  Your heart rate will be watched while you are resting and while you are exercising.  You will walk on the treadmill. The treadmill will slowly get faster to raise your heart rate.  The test will take about 1-2 hours. AFTER THE PROCEDURE  Your heart rate and blood pressure will be watched after the test.  You may return to your normal diet, activities, and medicines or as told by your doctor. Document Released: 11/29/2007 Document Revised: 10/27/2013 Document Reviewed: 02/17/2013 [facility] Patient Information 2015 [place], [place]. This information is not intended to replace advice given to you by your health care provider. Make sure you discuss any questions you have with your health care provider.

## 2015-02-11 ENCOUNTER — Telehealth (HOSPITAL_COMMUNITY): Payer: Self-pay

## 2015-02-11 NOTE — Telephone Encounter (Signed)
Encounter complete. 

## 2015-02-16 ENCOUNTER — Inpatient Hospital Stay (HOSPITAL_COMMUNITY): Admission: RE | Admit: 2015-02-16 | Payer: Federal, State, Local not specified - PPO | Source: Ambulatory Visit

## 2015-03-03 ENCOUNTER — Ambulatory Visit (HOSPITAL_COMMUNITY)
Admission: RE | Admit: 2015-03-03 | Discharge: 2015-03-03 | Disposition: A | Discharge status: Home / Self Care | Payer: Federal, State, Local not specified - PPO | Source: Ambulatory Visit | Attending: Cardiovascular Disease | Admitting: Cardiovascular Disease

## 2015-03-03 DIAGNOSIS — R0989 Other specified symptoms and signs involving the circulatory and respiratory systems: Secondary | ICD-10-CM | POA: Diagnosis not present

## 2015-03-03 DIAGNOSIS — E785 Hyperlipidemia, unspecified: Secondary | ICD-10-CM | POA: Insufficient documentation

## 2015-03-03 DIAGNOSIS — I10 Essential (primary) hypertension: Secondary | ICD-10-CM | POA: Insufficient documentation

## 2015-03-10 ENCOUNTER — Ambulatory Visit: Payer: Federal, State, Local not specified - PPO | Admitting: Family

## 2015-03-24 ENCOUNTER — Ambulatory Visit (INDEPENDENT_AMBULATORY_CARE_PROVIDER_SITE_OTHER): Payer: Federal, State, Local not specified - PPO | Admitting: Family

## 2015-03-24 ENCOUNTER — Encounter: Payer: Self-pay | Admitting: Family

## 2015-03-24 VITALS — BP 180/90 | HR 67 | Temp 99.1°F | Resp 18 | Ht 63.0 in | Wt 160.6 lb

## 2015-03-24 DIAGNOSIS — I10 Essential (primary) hypertension: Secondary | ICD-10-CM | POA: Diagnosis not present

## 2015-03-24 MED ORDER — METOPROLOL SUCCINATE ER 25 MG PO TB24: 50.0000 mg | ORAL_TABLET | Freq: Every day | ORAL | Status: DC

## 2015-03-24 MED ORDER — HYDROCHLOROTHIAZIDE 12.5 MG PO CAPS: 12.5000 mg | ORAL_CAPSULE | Freq: Every day | ORAL | Status: DC

## 2015-03-24 MED ORDER — LOSARTAN POTASSIUM 50 MG PO TABS: 50.0000 mg | ORAL_TABLET | Freq: Every day | ORAL | Status: DC

## 2015-03-24 NOTE — Progress Notes (Signed)
Subjective:    Patient ID: Brandy Stevens, female    DOB: 1935/10/13, 79 y.o.   MRN: [id number]  HPI  Brandy Stevens is a 79 yr old female who presents today for follow up of her HTN. She denies chest pain.    BP Readings from Last 3 Encounters:  03/24/15 180/90  01/25/15 152/98  12/17/14 178/78   Pt reports that her home BP cuff sbp at home remains <140.  Reports DBP 60-74.      Review of Systems    see HPI  Past Medical History  Diagnosis Date  . History of chicken pox   . Allergy     allergic rhinitis  . Anemia     nos  . Hypertension   . Systolic murmur     Normal valves on 2-d echo 5/11    Social History   Social History  . Marital Status: Widowed    Spouse Name: N/A  . Number of Children: 1  . Years of Education: N/A   Occupational History  . NURSE    Social History Main Topics  . Smoking status: Never Smoker   . Smokeless tobacco: Never Used  . Alcohol Use: No  . Drug Use: No  . Sexual Activity: Not on file   Other Topics Concern  . Not on file   Social History Narrative   7 brothers-- 1 died from unknown cause, 1 died from pneumonia. 5 still living.   1 son a & w          Past Surgical History  Procedure Laterality Date  . Right oophorectomy  1968    Family History  Problem Relation Age of Onset  . Pneumonia Father   . Other Mother     complications from hip fx    Allergies  Allergen Reactions  . Ace Inhibitors     cough    Current Outpatient Prescriptions on File Prior to Visit  Medication Sig Dispense Refill  . aspirin EC 81 MG tablet Take 81 mg by mouth daily.      . Calcium Carbonate-Vitamin D (CALTRATE 600+D) 600-400 MG-UNIT per tablet Take 1 tablet by mouth 2 (two) times daily.    . cholecalciferol (VITAMIN D) 1000 UNITS tablet Take 2,000 Units by mouth daily.    Marland Kitchen docusate sodium (COLACE) 100 MG capsule Take 1 capsule by mouth 1 to 2 times daily as needed.    . ferrous sulfate (FERROUSUL) 325 (65 FE) MG tablet Take 325 mg  by mouth daily with breakfast.     . Omega-3 Fatty Acids (FISH OIL) 1000 MG CAPS Take 1 capsule by mouth daily. Currently takes it once a week in the summer.    . [DISCONTINUED] lisinopril (PRINIVIL,ZESTRIL) 10 MG tablet Take 1 tablet (10 mg total) by mouth daily. 30 tablet 3   No current facility-administered medications on file prior to visit.    BP 180/90 mmHg  Pulse 67  Temp(Src) 99.1 F (37.3 C) (Oral)  Resp 18  Ht  (1.6 m)  Wt 160 lb 9.6 oz (72.848 kg)  BMI 28.46 kg/m2  SpO2 100%    Objective:   Physical Exam  Constitutional: She is oriented to person, place, and time. She appears well-developed and well-nourished.  Cardiovascular: Normal rate and regular rhythm.   Murmur heard. Musculoskeletal:  1+ bilateral LE edema  Neurological: She is alert and oriented to person, place, and time.          Assessment &  Plan:

## 2015-03-24 NOTE — Progress Notes (Signed)
Pre visit review using our clinic review tool, if applicable. No additional management support is needed unless otherwise documented below in the visit note. 

## 2015-03-24 NOTE — Patient Instructions (Addendum)
Increase toprol xl  to 2 tabs by mouth once daily.  If SBP <110, take only 1 tab of toprol. Check blood pressure once daily. Follow up in 1 week for nurse visit BP check, bring your meter for comparison, bring your blood pressure log for review.   Follow up for office visit in 3 months.

## 2015-03-25 NOTE — Assessment & Plan Note (Signed)
BP is extremely high in the office.  Much better at home.  Possible white coat HTN, however historically this has not been an issue for her.  Advised pt as follows:    Increase toprol xl  to 2 tabs by mouth once daily.  If SBP <110, take only 1 tab of toprol. Check blood pressure once daily. Follow up in 1 week for nurse visit BP check, bring your meter for comparison, bring your blood pressure log for review.   Follow up for office visit in 3 months.

## 2015-03-31 ENCOUNTER — Ambulatory Visit (INDEPENDENT_AMBULATORY_CARE_PROVIDER_SITE_OTHER): Payer: Federal, State, Local not specified - PPO | Admitting: Family

## 2015-03-31 VITALS — BP 147/76 | HR 63

## 2015-03-31 DIAGNOSIS — I10 Essential (primary) hypertension: Secondary | ICD-10-CM

## 2015-03-31 NOTE — Progress Notes (Signed)
Pre visit review using our clinic review tool, if applicable. No additional management support is needed unless otherwise documented below in the visit note.  Essential hypertension - Sandford Craze, NP at 03/25/2015 9:07 AM     Status: Written Related Problem: Essential hypertension   Expand All Collapse All   BP is extremely high in the office. Much better at home. Possible white coat HTN, however historically this has not been an issue for her. Advised pt as follows:   Increase toprol xl  to 2 tabs by mouth once daily. If SBP <110, take only 1 tab of toprol. Check blood pressure once daily. Follow up in 1 week for nurse visit BP check, bring your meter for comparison, bring your blood pressure log for review.  Follow up for office visit in 3 months.             Patient in for Blood Pressure check.  No SOB,Chest pain Dizziness voiced. Patient brought in BP machine she uses at home along with BP log. Log given to M. Osullivan for review. Took BP using patients machine.  See vital signs for details.

## 2015-03-31 NOTE — Patient Instructions (Signed)
Per Macario Carls. Continue Toprolol 50 mg daily and follow up in 3 months.

## 2015-03-31 NOTE — Progress Notes (Signed)
   Subjective:    Patient ID: Brandy Stevens, female    DOB: 1935-07-22, 79 y.o.   MRN: [id number]  HPI    Review of Systems     Objective:   Physical Exam        Assessment & Plan:  Noted pt's bp. Continue increased dose of toprol xl.

## 2015-06-30 ENCOUNTER — Ambulatory Visit: Payer: Federal, State, Local not specified - PPO | Admitting: Family

## 2015-08-25 ENCOUNTER — Telehealth: Payer: Self-pay | Admitting: Family

## 2015-08-25 NOTE — Telephone Encounter (Signed)
Left message for patient to call about Flu Shot °

## 2018-04-15 ENCOUNTER — Other Ambulatory Visit: Payer: Self-pay

## 2018-04-15 ENCOUNTER — Encounter (HOSPITAL_BASED_OUTPATIENT_CLINIC_OR_DEPARTMENT_OTHER): Payer: Self-pay | Admitting: Emergency Medicine

## 2018-04-15 ENCOUNTER — Emergency Department (HOSPITAL_BASED_OUTPATIENT_CLINIC_OR_DEPARTMENT_OTHER)
Admission: EM | Admit: 2018-04-15 | Discharge: 2018-04-15 | Disposition: A | Discharge status: Home / Self Care | Payer: Federal, State, Local not specified - PPO | Attending: Emergency Medicine | Admitting: Emergency Medicine

## 2018-04-15 ENCOUNTER — Emergency Department (HOSPITAL_BASED_OUTPATIENT_CLINIC_OR_DEPARTMENT_OTHER): Payer: Federal, State, Local not specified - PPO

## 2018-04-15 DIAGNOSIS — Z7982 Long term (current) use of aspirin: Secondary | ICD-10-CM | POA: Diagnosis not present

## 2018-04-15 DIAGNOSIS — I10 Essential (primary) hypertension: Secondary | ICD-10-CM | POA: Insufficient documentation

## 2018-04-15 DIAGNOSIS — M7989 Other specified soft tissue disorders: Secondary | ICD-10-CM | POA: Diagnosis present

## 2018-04-15 DIAGNOSIS — Z79899 Other long term (current) drug therapy: Secondary | ICD-10-CM | POA: Insufficient documentation

## 2018-04-15 DIAGNOSIS — L03116 Cellulitis of left lower limb: Secondary | ICD-10-CM | POA: Diagnosis not present

## 2018-04-15 LAB — BASIC METABOLIC PANEL
ANION GAP: 10 (ref 5–15)
BUN: 18 mg/dL (ref 8–23)
CALCIUM: 9.2 mg/dL (ref 8.9–10.3)
CO2: 25 mmol/L (ref 22–32)
Chloride: 107 mmol/L (ref 98–111)
Creatinine, Ser: 0.9 mg/dL (ref 0.44–1.00)
GFR calc Af Amer: >60 mL/min (ref >60)
GFR, EST NON AFRICAN AMERICAN: 59 mL/min — AB (ref >60)
Glucose, Bld: 88 mg/dL (ref 70–99)
POTASSIUM: 3.5 mmol/L (ref 3.5–5.1)
SODIUM: 142 mmol/L (ref 135–145)

## 2018-04-15 LAB — CBC WITH DIFFERENTIAL/PLATELET
ABS IMMATURE GRANULOCYTES: 0.02 10*3/uL (ref 0.00–0.07)
Basophils Absolute: 0 10*3/uL (ref 0.0–0.1)
Basophils Relative: 1 %
Eosinophils Absolute: 0.2 10*3/uL (ref 0.0–0.5)
Eosinophils Relative: 4 %
HCT: 40.5 % (ref 36.0–46.0)
HEMOGLOBIN: 12.1 g/dL (ref 12.0–15.0)
IMMATURE GRANULOCYTES: 0 %
LYMPHS PCT: 38 %
Lymphs Abs: 2.2 10*3/uL (ref 0.7–4.0)
MCH: 26.9 pg (ref 26.0–34.0)
MCHC: 29.9 g/dL — ABNORMAL LOW (ref 30.0–36.0)
MCV: 90 fL (ref 80.0–100.0)
MONO ABS: 0.5 10*3/uL (ref 0.1–1.0)
MONOS PCT: 9 %
Neutro Abs: 2.9 10*3/uL (ref 1.7–7.7)
Neutrophils Relative %: 48 %
Platelets: 230 10*3/uL (ref 150–400)
RBC: 4.5 MIL/uL (ref 3.87–5.11)
RDW: 14.7 % (ref 11.5–15.5)
WBC: 5.9 10*3/uL (ref 4.0–10.5)
nRBC: 0 % (ref 0.0–0.2)

## 2018-04-15 MED ORDER — CEPHALEXIN 500 MG PO CAPS: 500.0000 mg | ORAL_CAPSULE | Freq: Two times a day (BID) | ORAL | 0 refills | Status: AC

## 2018-04-15 MED ORDER — HYDROCHLOROTHIAZIDE 25 MG PO TABS: 25.0000 mg | ORAL_TABLET | Freq: Every day | ORAL | 0 refills | Status: DC

## 2018-04-15 MED ORDER — HYDROCHLOROTHIAZIDE 25 MG PO TABS
25.0000 mg | ORAL_TABLET | Freq: Once | ORAL | Status: AC
  Administered 2018-04-15: 25 mg via ORAL
  Filled 2018-04-15: qty 1

## 2018-04-15 MED ORDER — HYDRALAZINE HCL 25 MG PO TABS
25.0000 mg | ORAL_TABLET | Freq: Once | ORAL | Status: AC
  Administered 2018-04-15: 25 mg via ORAL
  Filled 2018-04-15: qty 1

## 2018-04-15 NOTE — ED Triage Notes (Signed)
Reports left lower leg swelling x 2 weeks. Reports insect bite that occurred prior to this.

## 2018-04-16 NOTE — ED Provider Notes (Signed)
[facility] EMERGENCY DEPARTMENT Provider Note   CSN: [id number] Arrival date & time: 04/15/18  1904     History   Chief Complaint Chief Complaint  Patient presents with  . Cellulitis    HPI Brandy Stevens is a 82 y.o. female.  HPI   Presents with LLE swelling for 2 weeks Was working outside and had insect bite initially with pruritis, however swelling worsened and noted more swelling and pain today No fevers or chills No chest pain or dyspnea Reports she stopped all of her blood pressure medications as she thought they were making her dizzy. Denies numbness, weakness, difficultly talking or walking, headache, visual changes  Past Medical History:  Diagnosis Date  . Allergy    allergic rhinitis  . Anemia    nos  . History of chicken pox   . Hypertension   . Systolic murmur    Normal valves on 2-d echo 5/11    Patient Active Problem List   Diagnosis Date Noted  . Hypertrophic obstructive cardiomyopathy (HCC) 01/25/2015  . Bruit 01/25/2015  . Preventative health care 10/30/2014  . Anemia, iron deficiency 07/15/2014  . Hyperlipemia 01/07/2014  . VITAMIN D DEFICIENCY 04/01/2010  . OSTEOPENIA 03/30/2010  . HEART MURMUR, SYSTOLIC 10/05/2009  . ANEMIA-NOS 08/25/2009  . Essential hypertension 08/25/2009  . ALLERGIC RHINITIS 08/25/2009    Past Surgical History:  Procedure Laterality Date  . RIGHT OOPHORECTOMY  1968     OB History   None      Home Medications    Prior to Admission medications   Medication Sig Start Date End Date Taking? Authorizing Provider  aspirin EC 81 MG tablet Take 81 mg by mouth daily.      [provider]  Calcium Carbonate-Vitamin D (CALTRATE 600+D) 600-400 MG-UNIT per tablet Take 1 tablet by mouth 2 (two) times daily.    [provider]  cephALEXin (KEFLEX) 500 MG capsule Take 1 capsule (500 mg total) by mouth 2 (two) times daily for 7 days. 04/15/18 04/22/18  Alvira Monday, MD  cholecalciferol  (VITAMIN D) 1000 UNITS tablet Take 2,000 Units by mouth daily. 01/09/11   Sandford Craze, NP  docusate sodium (COLACE) 100 MG capsule Take 1 capsule by mouth 1 to 2 times daily as needed.    [provider]  ferrous sulfate (FERROUSUL) 325 (65 FE) MG tablet Take 325 mg by mouth daily with breakfast.     [provider]  hydrochlorothiazide (HYDRODIURIL) 25 MG tablet Take 1 tablet (25 mg total) by mouth daily. 04/15/18 05/15/18  Alvira Monday, MD  Omega-3 Fatty Acids (FISH OIL) 1000 MG CAPS Take 1 capsule by mouth daily. Currently takes it once a week in the summer.    [provider]  lisinopril (PRINIVIL,ZESTRIL) 10 MG tablet Take 1 tablet (10 mg total) by mouth daily. 06/05/11 09/04/11  Sandford Craze, NP    Family History Family History  Problem Relation Age of Onset  . Pneumonia Father   . Other Mother        complications from hip fx    Social History Social History   Tobacco Use  . Smoking status: Never Smoker  . Smokeless tobacco: Never Used  Substance Use Topics  . Alcohol use: No  . Drug use: No     Allergies   Ace inhibitors   Review of Systems Review of Systems  Constitutional: Negative for fever.  HENT: Negative for sore throat.   Eyes: Negative for visual disturbance.  Respiratory:  Negative for cough and shortness of breath.   Cardiovascular: Negative for chest pain.  Gastrointestinal: Negative for abdominal pain, nausea and vomiting.  Genitourinary: Negative for difficulty urinating.  Musculoskeletal: Positive for myalgias. Negative for back pain and neck pain.  Skin: Positive for rash.  Neurological: Negative for syncope and headaches.     Physical Exam Updated Vital Signs BP (!) 221/81   Pulse 79   Temp 98.6 F (37 C) (Oral)   Resp 18   Ht 5\' 2"  (1.575 m)   Wt 75.8 kg   SpO2 98%   BMI 30.54 kg/m   Physical Exam  Constitutional: She is oriented to person, place, and time. She appears well-developed and  well-nourished. No distress.  HENT:  Head: Normocephalic and atraumatic.  Eyes: Conjunctivae and EOM are normal.  Neck: Normal range of motion.  Cardiovascular: Normal rate, regular rhythm, normal heart sounds and intact distal pulses. Exam reveals no gallop and no friction rub.  No murmur heard. Pulmonary/Chest: Effort normal and breath sounds normal. No respiratory distress. She has no wheezes. She has no rales.  Abdominal: Soft. She exhibits no distension. There is no tenderness. There is no guarding.  Musculoskeletal: She exhibits no edema or tenderness.  Mild swelling, erythema and warmth to distal LLE    Neurological: She is alert and oriented to person, place, and time.  Skin: Skin is warm and dry. No rash noted. She is not diaphoretic. No erythema.  Nursing note and vitals reviewed.    ED Treatments / Results  Labs (all labs ordered are listed, but only abnormal results are displayed) Labs Reviewed  CBC WITH DIFFERENTIAL/PLATELET - Abnormal; Notable for the following components:      Result Value   MCHC 29.9 (*)    All other components within normal limits  BASIC METABOLIC PANEL - Abnormal; Notable for the following components:   GFR calc non Af Amer 59 (*)    All other components within normal limits    EKG None  Radiology US Venous Img Lower Unilateral Left  Result Date: 04/15/2018 CLINICAL DATA:  Left lower extremity swelling EXAM: LEFT LOWER EXTREMITY VENOUS DOPPLER ULTRASOUND TECHNIQUE: Gray-scale sonography with graded compression, as well as color Doppler and duplex ultrasound were performed to evaluate the lower extremity deep venous systems from the level of the common femoral vein and including the common femoral, femoral, profunda femoral, popliteal and calf veins including the posterior tibial, peroneal and gastrocnemius veins when visible. The superficial great saphenous vein was also interrogated. Spectral Doppler was utilized to evaluate flow at rest and  with distal augmentation maneuvers in the common femoral, femoral and popliteal veins. COMPARISON:  None. FINDINGS: Contralateral Common Femoral Vein: Respiratory phasicity is normal and symmetric with the symptomatic side. No evidence of thrombus. Normal compressibility. Common Femoral Vein: No evidence of thrombus. Normal compressibility, respiratory phasicity and response to augmentation. Saphenofemoral Junction: No evidence of thrombus. Normal compressibility and flow on color Doppler imaging. Profunda Femoral Vein: No evidence of thrombus. Normal compressibility and flow on color Doppler imaging. Femoral Vein: No evidence of thrombus. Normal compressibility, respiratory phasicity and response to augmentation. Popliteal Vein: No evidence of thrombus. Normal compressibility, respiratory phasicity and response to augmentation. Calf Veins: No evidence of thrombus. Normal compressibility and flow on color Doppler imaging. Superficial Great Saphenous Vein: No evidence of thrombus. Normal compressibility. Venous Reflux:  None. Other Findings:  None. IMPRESSION: No evidence of deep venous thrombosis. Electronically Signed   By: Charlett Nose M.D.  On: 04/15/2018 22:29    Procedures Procedures (including critical care time)  Medications Ordered in ED Medications  hydrALAZINE (APRESOLINE) tablet 25 mg (25 mg Oral Given 04/15/18 2129)  hydrochlorothiazide (HYDRODIURIL) tablet 25 mg (25 mg Oral Given 04/15/18 2132)     Initial Impression / Assessment and Plan / ED Course  I have reviewed the triage vital signs and the nursing notes.  Pertinent labs & imaging results that were available during my care of the patient were reviewed by me and considered in my medical decision making (see chart for details).     82yo female with history above including htn presents with concern for LLE pain and swelling.  DVT US negative. Normal distal pulses, doubt acute arterial thrombus.  Suspect presentation consistent  with mild cellulitis. No sign of abscess nor sepsis. Given rx for keflex.  Patient also noted to be hypertensive to 240/120s. Has been off of all blood pressure medications due to side effects.  Patient without headache, no neurologic symptoms, no chest pain, no shortness of breath and have low suspicion for hypertensive emergencies including low suspicion for [facility], hypertensive encephalopathy, stroke, MI, aortic dissection, pulmonary edema.  BMP was checked showing normal renal function.  Given a dose of hydralazine and hydrochlorothiazide.  Patient blood pressure decreased to 218 over 80s.  Will initiate 25 mg of hydrochlorothiazide, and discussed importance of very close primary care physician follow-up.  Discussed importance of close primary care follow up and reasons to return to the ED in detail. Patient discharged in stable condition with understanding of reasons to return.    Final Clinical Impressions(s) / ED Diagnoses   Final diagnoses:  Essential hypertension  Cellulitis of left lower extremity    ED Discharge Orders         Ordered    cephALEXin (KEFLEX) 500 MG capsule  2 times daily     04/15/18 2301    hydrochlorothiazide (HYDRODIURIL) 25 MG tablet  Daily     04/15/18 2301           Alvira Monday, MD 04/16/18 1053

## 2018-04-18 ENCOUNTER — Telehealth: Payer: Self-pay

## 2018-04-18 NOTE — Telephone Encounter (Signed)
Please call Pt- okay to re-establish w/ Melissa- please schedule at her convenience.

## 2018-04-18 NOTE — Telephone Encounter (Signed)
Please advise- Pt requesting to re-establish care.

## 2018-04-18 NOTE — Telephone Encounter (Signed)
Copied from CRM [id number]. Topic: Appointment Scheduling - Scheduling Inquiry for Clinic >> Apr 18, 2018  2:07 PM Harlan Stains wrote: Patient was last seen 03/31/2015 by Sandford Craze and is requesting to see her again for BP issues  Contact pt for scheduling [phone]

## 2018-04-18 NOTE — Telephone Encounter (Signed)
Ok

## 2018-04-23 NOTE — Telephone Encounter (Signed)
Called pt and set an appt to re-establish care for 04/29/18

## 2018-04-29 ENCOUNTER — Ambulatory Visit: Payer: Federal, State, Local not specified - PPO | Admitting: Family

## 2018-05-01 ENCOUNTER — Ambulatory Visit: Payer: Federal, State, Local not specified - PPO | Admitting: Family

## 2018-05-01 ENCOUNTER — Encounter: Payer: Self-pay | Admitting: Family

## 2018-05-01 VITALS — BP 180/82 | HR 78 | Temp 98.6°F | Resp 18 | Ht 63.0 in | Wt 164.4 lb

## 2018-05-01 DIAGNOSIS — D509 Iron deficiency anemia, unspecified: Secondary | ICD-10-CM | POA: Diagnosis not present

## 2018-05-01 DIAGNOSIS — I10 Essential (primary) hypertension: Secondary | ICD-10-CM

## 2018-05-01 DIAGNOSIS — L03116 Cellulitis of left lower limb: Secondary | ICD-10-CM | POA: Diagnosis not present

## 2018-05-01 LAB — COMPREHENSIVE METABOLIC PANEL
ALBUMIN: 4 g/dL (ref 3.5–5.2)
ALT: 9 U/L (ref 0–35)
AST: 19 U/L (ref 0–37)
Alkaline Phosphatase: 83 U/L (ref 39–117)
BUN: 19 mg/dL (ref 6–23)
CALCIUM: 9.3 mg/dL (ref 8.4–10.5)
CO2: 32 mEq/L (ref 19–32)
Chloride: 104 mEq/L (ref 96–112)
Creatinine, Ser: 0.85 mg/dL (ref 0.40–1.20)
GFR: 82.6 mL/min (ref >60.00)
Glucose, Bld: 78 mg/dL (ref 70–99)
Potassium: 3.8 mEq/L (ref 3.5–5.1)
Sodium: 142 mEq/L (ref 135–145)
Total Bilirubin: 0.4 mg/dL (ref 0.2–1.2)
Total Protein: 7 g/dL (ref 6.0–8.3)

## 2018-05-01 LAB — IRON: IRON: 43 ug/dL (ref 42–145)

## 2018-05-01 MED ORDER — DOXYCYCLINE HYCLATE 100 MG PO TABS: 100.0000 mg | ORAL_TABLET | Freq: Two times a day (BID) | ORAL | 0 refills | Status: DC

## 2018-05-01 MED ORDER — LOSARTAN POTASSIUM-HCTZ 50-12.5 MG PO TABS: 1.0000 | ORAL_TABLET | Freq: Every day | ORAL | 3 refills | Status: DC

## 2018-05-01 NOTE — Patient Instructions (Addendum)
Complete lab work prior to leaving. Stop hctz, start losartan hctz. Start doxycycline twice daily for cellulitis. Call if increased pain/swelling, redness or if you develop fever.

## 2018-05-01 NOTE — Progress Notes (Signed)
Subjective:    Patient ID: Brandy Stevens, female    DOB: [birth year], 82 y.o.   MRN: [id number]  HPI   Patient is an 82 year old African-American female who presents today to reestablish care.  HTN- reports non-compliance with her blood pressure.  BP Readings from Last 3 Encounters:  05/01/18 (!) 180/82  04/15/18 (!) 221/81  03/31/15 (!) 147/76    Reports that she has had some LLE swelling.  Reports that she went to the emergency department on 04/15/2018 for same and was prescribed Keflex.  ED record is reviewed.  She had a negative left lower extremity Doppler that day as well.  She took keflex for 7 days on 04/15/18.  She notes continued erythema and swelling.  Anemia- uses iron intermittently.   Review of Systems  HENT: Negative for rhinorrhea.   Eyes: Negative for visual disturbance.  Respiratory: Negative for cough.   Cardiovascular: Positive for leg swelling.  Gastrointestinal: Negative for blood in stool, constipation and diarrhea.  Genitourinary: Negative for dysuria and frequency.  Musculoskeletal: Negative for arthralgias.  Skin: Negative for rash.  Neurological:       Reports no headaches  Hematological: Negative for adenopathy.  Psychiatric/Behavioral:       Denies depression/anxiety    Past Medical History:  Diagnosis Date  . Allergy    allergic rhinitis  . Anemia    nos  . History of chicken pox   . Hypertension   . Systolic murmur    Normal valves on 2-d echo 5/11     Social History   Socioeconomic History  . Marital status: Widowed    Spouse name: Not on file  . Number of children: 1  . Years of education: Not on file  . Highest education level: Not on file  Occupational History  . Occupation: Optician, dispensing: [employer]  . Financial resource strain: Not on file  . Food insecurity:    Worry: Not on file    Inability: Not on file  . Transportation needs:    Medical: Not on file    Non-medical: Not on file    Tobacco Use  . Smoking status: Never Smoker  . Smokeless tobacco: Never Used  Substance and Sexual Activity  . Alcohol use: No  . Drug use: No  . Sexual activity: Not on file  Lifestyle  . Physical activity:    Days per week: Not on file    Minutes per session: Not on file  . Stress: Not on file  Relationships  . Social connections:    Talks on phone: Not on file    Gets together: Not on file    Attends religious service: Not on file    Active member of club or organization: Not on file    Attends meetings of clubs or organizations: Not on file    Relationship status: Not on file  . Intimate partner violence:    Fear of current or ex partner: Not on file    Emotionally abused: Not on file    Physically abused: Not on file    Forced sexual activity: Not on file  Other Topics Concern  . Not on file  Social History Narrative   7 brothers-- 1 died from unknown cause, 1 died from pneumonia. 5 still living.   1 son a & w    Past Surgical History:  Procedure Laterality Date  . RIGHT OOPHORECTOMY  1968    Family History  Problem Relation Age of Onset  . Other Mother        complications from hip fx  . Pneumonia Father   . Osteoarthritis Brother   . Actinic keratosis Brother     Allergies  Allergen Reactions  . Ace Inhibitors     cough    Current Outpatient Medications on File Prior to Visit  Medication Sig Dispense Refill  . Calcium Carbonate-Vitamin D (CALTRATE 600+D) 600-400 MG-UNIT per tablet Take 1 tablet by mouth 2 (two) times daily.    . cholecalciferol (VITAMIN D) 1000 UNITS tablet Take 2,000 Units by mouth daily.    Marland Kitchen docusate sodium (COLACE) 100 MG capsule Take 1 capsule by mouth 1 to 2 times daily as needed.    . ferrous sulfate (FERROUSUL) 325 (65 FE) MG tablet Take 325 mg by mouth daily with breakfast.     . Omega-3 Fatty Acids (FISH OIL) 1000 MG CAPS Take 1 capsule by mouth daily. Currently takes it once a week in the summer.    . [DISCONTINUED]  lisinopril (PRINIVIL,ZESTRIL) 10 MG tablet Take 1 tablet (10 mg total) by mouth daily. 30 tablet 3   No current facility-administered medications on file prior to visit.     BP (!) 180/82 (BP Location: Right Arm, Patient Position: Sitting, Cuff Size: Normal)   Pulse 78   Temp 98.6 F (37 C) (Oral)   Resp 18   Ht '5\' 3"'$  (1.6 m)   Wt 164 lb 6.4 oz (74.6 kg)   SpO2 100%   BMI 29.12 kg/m        Objective:   Physical Exam  Constitutional: She is oriented to person, place, and time. She appears well-developed and well-nourished.  HENT:  Head: Normocephalic and atraumatic.  Eyes: Scleral icterus is present.  Neck: Neck supple. No thyromegaly present.  Cardiovascular: Normal rate, regular rhythm and normal heart sounds.  Grade 1-2 systolic murmur  Pulmonary/Chest: Effort normal and breath sounds normal. No respiratory distress. She has no wheezes.  Abdominal: Soft. She exhibits no distension.  Musculoskeletal:  Positive swelling and mild erythema of left shin/calf  Neurological: She is alert and oriented to person, place, and time. She exhibits normal muscle tone.  Skin: Skin is warm and dry.  Psychiatric: She has a normal mood and affect. Her behavior is normal. Judgment and thought content normal.          Assessment & Plan:  Cellulitis left lower extremity- we will treat with doxycycline to cover for MRSA.  She will elevate her leg as much as possible.  Plan follow-up in 1 week.  Hypertension- blood pressure remains elevated but is better than her visit to the ED a few weeks back.  DC hydrochlorothiazide and change to losartan HCT.  Obtain follow-up electrolytes today.  Plan repeat blood pressure and be met in 1 week.  Iron deficiency anemia- we will check follow-up serum iron level.  Blood count was normal few weeks ago in the ED.  Continue oral iron.  Patient declines flu shot.

## 2018-05-02 ENCOUNTER — Encounter: Payer: Self-pay | Admitting: Family

## 2018-05-10 ENCOUNTER — Encounter: Payer: Self-pay | Admitting: Family

## 2018-05-10 ENCOUNTER — Ambulatory Visit: Payer: Federal, State, Local not specified - PPO | Admitting: Family

## 2018-05-10 VITALS — BP 146/78 | HR 87 | Temp 98.0°F | Resp 16 | Ht 63.0 in | Wt 164.0 lb

## 2018-05-10 DIAGNOSIS — L0390 Cellulitis, unspecified: Secondary | ICD-10-CM

## 2018-05-10 DIAGNOSIS — I10 Essential (primary) hypertension: Secondary | ICD-10-CM | POA: Diagnosis not present

## 2018-05-10 LAB — BASIC METABOLIC PANEL
BUN: 23 mg/dL (ref 6–23)
CALCIUM: 9.1 mg/dL (ref 8.4–10.5)
CO2: 34 mEq/L — ABNORMAL HIGH (ref 19–32)
Chloride: 105 mEq/L (ref 96–112)
Creatinine, Ser: 1.05 mg/dL (ref 0.40–1.20)
GFR: 64.72 mL/min (ref >60.00)
GLUCOSE: 92 mg/dL (ref 70–99)
Potassium: 3.8 mEq/L (ref 3.5–5.1)
SODIUM: 144 meq/L (ref 135–145)

## 2018-05-10 MED ORDER — CEPHALEXIN 500 MG PO CAPS: 500.0000 mg | ORAL_CAPSULE | Freq: Three times a day (TID) | ORAL | 0 refills | Status: DC

## 2018-05-10 NOTE — Patient Instructions (Signed)
Please complete lab work prior to leaving. Start keflex.

## 2018-05-10 NOTE — Progress Notes (Signed)
Subjective:    Patient ID: Brandy Stevens, female    DOB: 1936-01-08, 82 y.o.   MRN: [id number]  HPI  Patient is an 82 year old female who presents today for follow-up.  left lower extremity cellulitis-last visit we gave her doxycycline and advised elevation as much as possible. She reports that the skin has been itching some. She completed the doxycycline.  Notes LE edema is improved.   HTN-last visit we discontinued hydrochlorothiazide and changed her to losartan hydrochlorothiazide. BP Readings from Last 3 Encounters:  05/10/18 (!) 146/78  05/01/18 (!) 180/82  04/15/18 (!) 221/81      Review of Systems See HPI  Past Medical History:  Diagnosis Date  . Allergy    allergic rhinitis  . Anemia    nos  . History of chicken pox   . Hypertension   . Systolic murmur    Normal valves on 2-d echo 5/11     Social History   Socioeconomic History  . Marital status: Widowed    Spouse name: Not on file  . Number of children: 1  . Years of education: Not on file  . Highest education level: Not on file  Occupational History  . Occupation: Academic librarian: [employer]  Social Needs  . Financial resource strain: Not on file  . Food insecurity:    Worry: Not on file    Inability: Not on file  . Transportation needs:    Medical: Not on file    Non-medical: Not on file  Tobacco Use  . Smoking status: Never Smoker  . Smokeless tobacco: Never Used  Substance and Sexual Activity  . Alcohol use: No  . Drug use: No  . Sexual activity: Not on file  Lifestyle  . Physical activity:    Days per week: Not on file    Minutes per session: Not on file  . Stress: Not on file  Relationships  . Social connections:    Talks on phone: Not on file    Gets together: Not on file    Attends religious service: Not on file    Active member of club or organization: Not on file    Attends meetings of clubs or organizations: Not on file    Relationship status: Not on file  .  Intimate partner violence:    Fear of current or ex partner: Not on file    Emotionally abused: Not on file    Physically abused: Not on file    Forced sexual activity: Not on file  Other Topics Concern  . Not on file  Social History Narrative   7 brothers-- 1 died from unknown cause, 1 died from pneumonia. 5 still living.   1 son living- lives locally, he has a son who lives locally-    One great granddaughter   Widowed, husband died in 11/25/05   Has two outdoor   Enjoys volunteering, Market researcher for shopping trips at [employer]    Past Surgical History:  Procedure Laterality Date  . RIGHT OOPHORECTOMY  1968    Family History  Problem Relation Age of Onset  . Other Mother        complications from hip fx  . Pneumonia Father   . Osteoarthritis Brother   . Actinic keratosis Brother     Allergies  Allergen Reactions  . Ace Inhibitors     cough    Current Outpatient Medications on File Prior to Visit  Medication  Sig Dispense Refill  . Calcium Carbonate-Vitamin D (CALTRATE 600+D) 600-400 MG-UNIT per tablet Take 1 tablet by mouth 2 (two) times daily.    . cholecalciferol (VITAMIN D) 1000 UNITS tablet Take 2,000 Units by mouth daily.    Marland Kitchen. doxycycline (VIBRA-TABS) 100 MG tablet Take 1 tablet (100 mg total) by mouth 2 (two) times daily. 20 tablet 0  . ferrous sulfate (FERROUSUL) 325 (65 FE) MG tablet Take 325 mg by mouth daily with breakfast.     . losartan-hydrochlorothiazide (HYZAAR) 50-12.5 MG tablet Take 1 tablet by mouth daily. 30 tablet 3  . Omega-3 Fatty Acids (FISH OIL) 1000 MG CAPS Take 1 capsule by mouth daily. Currently takes it once a week in the summer.    . docusate sodium (COLACE) 100 MG capsule Take 1 capsule by mouth 1 to 2 times daily as needed.    . [DISCONTINUED] lisinopril (PRINIVIL,ZESTRIL) 10 MG tablet Take 1 tablet (10 mg total) by mouth daily. 30 tablet 3   No current facility-administered medications on file prior to visit.     BP (!)  146/78 (BP Location: Right Arm, Patient Position: Sitting, Cuff Size: Normal)   Pulse 87   Temp 98 F (36.7 C) (Oral)   Resp 16   Ht 5\' 3"  (1.6 m)   Wt 164 lb (74.4 kg)   SpO2 98%   BMI 29.05 kg/m       Objective:   Physical Exam  Constitutional: She is oriented to person, place, and time. She appears well-developed and well-nourished.  Cardiovascular: Normal rate, regular rhythm and normal heart sounds.  No murmur heard. Pulmonary/Chest: Effort normal and breath sounds normal. No respiratory distress. She has no wheezes.  Musculoskeletal:  2+ LLE edema, mild peeling of skin left lateral shn and mild erythema.  Neurological: She is alert and oriented to person, place, and time.  Psychiatric: She has a normal mood and affect. Her behavior is normal. Judgment and thought content normal.          Assessment & Plan:  Cellulitis- improving but still has some erythema. Will rx with an additional 7 days of keflex.  Plan follow up in 1 week.  HTN- BP much better today. Continue current meds. Obtain follow up bmet.

## 2018-05-17 ENCOUNTER — Encounter: Payer: Self-pay | Admitting: Family

## 2018-05-17 ENCOUNTER — Ambulatory Visit: Payer: Federal, State, Local not specified - PPO | Admitting: Family

## 2018-05-17 VITALS — BP 163/105 | HR 85 | Temp 98.5°F | Ht 63.0 in | Wt 164.0 lb

## 2018-05-17 DIAGNOSIS — L02416 Cutaneous abscess of left lower limb: Secondary | ICD-10-CM

## 2018-05-17 DIAGNOSIS — I10 Essential (primary) hypertension: Secondary | ICD-10-CM | POA: Diagnosis not present

## 2018-05-17 DIAGNOSIS — L03116 Cellulitis of left lower limb: Secondary | ICD-10-CM

## 2018-05-17 MED ORDER — LOSARTAN POTASSIUM-HCTZ 100-12.5 MG PO TABS: 1.0000 | ORAL_TABLET | Freq: Every day | ORAL | 2 refills | Status: DC

## 2018-05-17 NOTE — Progress Notes (Signed)
Subjective:    Patient ID: Brandy Stevens, female    DOB: 1935-11-05, 82 y.o.   MRN: [id number]  HPI  Patient presents today for follow up:  LLE cellulitis-  Last visit she still had some erythema and we extended her abx with keflex. Today she reports significant improvement.  HTN- Pt reports compliance with her medication.  BP Readings from Last 3 Encounters:  05/17/18 (!) 190/88  05/10/18 (!) 146/78  05/01/18 (!) 180/82      Review of Systems See HPI  Past Medical History:  Diagnosis Date  . Allergy    allergic rhinitis  . Anemia    nos  . History of chicken pox   . Hypertension   . Systolic murmur    Normal valves on 2-d echo 5/11     Social History   Socioeconomic History  . Marital status: Widowed    Spouse name: Not on file  . Number of children: 1  . Years of education: Not on file  . Highest education level: Not on file  Occupational History  . Occupation: Academic librarian: [employer]  Social Needs  . Financial resource strain: Not on file  . Food insecurity:    Worry: Not on file    Inability: Not on file  . Transportation needs:    Medical: Not on file    Non-medical: Not on file  Tobacco Use  . Smoking status: Never Smoker  . Smokeless tobacco: Never Used  Substance and Sexual Activity  . Alcohol use: No  . Drug use: No  . Sexual activity: Not on file  Lifestyle  . Physical activity:    Days per week: Not on file    Minutes per session: Not on file  . Stress: Not on file  Relationships  . Social connections:    Talks on phone: Not on file    Gets together: Not on file    Attends religious service: Not on file    Active member of club or organization: Not on file    Attends meetings of clubs or organizations: Not on file    Relationship status: Not on file  . Intimate partner violence:    Fear of current or ex partner: Not on file    Emotionally abused: Not on file    Physically abused: Not on file    Forced sexual  activity: Not on file  Other Topics Concern  . Not on file  Social History Narrative   7 brothers-- 1 died from unknown cause, 1 died from pneumonia. 5 still living.   1 son living- lives locally, he has a son who lives locally-    One great granddaughter   Widowed, husband died in 11/17/2005   Has two outdoor   Enjoys volunteering, Market researcher for shopping trips at [employer]    Past Surgical History:  Procedure Laterality Date  . RIGHT OOPHORECTOMY  1968    Family History  Problem Relation Age of Onset  . Other Mother        complications from hip fx  . Pneumonia Father   . Osteoarthritis Brother   . Actinic keratosis Brother     Allergies  Allergen Reactions  . Ace Inhibitors     cough    Current Outpatient Medications on File Prior to Visit  Medication Sig Dispense Refill  . Calcium Carbonate-Vitamin D (CALTRATE 600+D) 600-400 MG-UNIT per tablet Take 1 tablet by mouth 2 (two)  times daily.    . cephALEXin (KEFLEX) 500 MG capsule Take 1 capsule (500 mg total) by mouth 3 (three) times daily. 21 capsule 0  . cholecalciferol (VITAMIN D) 1000 UNITS tablet Take 2,000 Units by mouth daily.    Marland Kitchen. docusate sodium (COLACE) 100 MG capsule Take 1 capsule by mouth 1 to 2 times daily as needed.    . ferrous sulfate (FERROUSUL) 325 (65 FE) MG tablet Take 325 mg by mouth daily with breakfast.     . losartan-hydrochlorothiazide (HYZAAR) 50-12.5 MG tablet Take 1 tablet by mouth daily. 30 tablet 3  . Omega-3 Fatty Acids (FISH OIL) 1000 MG CAPS Take 1 capsule by mouth daily. Currently takes it once a week in the summer.    . [DISCONTINUED] lisinopril (PRINIVIL,ZESTRIL) 10 MG tablet Take 1 tablet (10 mg total) by mouth daily. 30 tablet 3   No current facility-administered medications on file prior to visit.     BP (!) 190/88 (BP Location: Right Arm, Patient Position: Sitting, Cuff Size: Normal)   Pulse 85   Temp 98.5 F (36.9 C) (Oral)   Ht 5\' 3"  (1.6 m)   Wt 164 lb (74.4  kg)   SpO2 100%   BMI 29.05 kg/m       Objective:   Physical Exam  Constitutional: She is oriented to person, place, and time. She appears well-developed and well-nourished.  Cardiovascular: Normal rate, regular rhythm and normal heart sounds.  No murmur heard. Pulmonary/Chest: Effort normal and breath sounds normal. No respiratory distress. She has no wheezes.  Neurological: She is alert and oriented to person, place, and time.  Skin: Skin is warm and dry.  Near resolution of erythema noted LLE. Some peelin of the skin is noted  Psychiatric: She has a normal mood and affect. Her behavior is normal. Judgment and thought content normal.          Assessment & Plan:  HTN- Uncontrolled.  BP Readings from Last 3 Encounters:  05/17/18 (!) 163/105  05/10/18 (!) 146/78  05/01/18 (!) 180/82   Repeat bp a bit better today but still above goal. Will increase losartan hct from 50-12.5 to 100-12.5mg .   Cellulitis- improved. Monitor.

## 2018-05-17 NOTE — Patient Instructions (Signed)
Please change losartan-hctz to 100-12.5mg  once daily.

## 2018-05-31 ENCOUNTER — Ambulatory Visit (INDEPENDENT_AMBULATORY_CARE_PROVIDER_SITE_OTHER): Payer: Federal, State, Local not specified - PPO | Admitting: Family

## 2018-05-31 ENCOUNTER — Other Ambulatory Visit: Payer: Federal, State, Local not specified - PPO

## 2018-05-31 DIAGNOSIS — I10 Essential (primary) hypertension: Secondary | ICD-10-CM | POA: Diagnosis not present

## 2018-05-31 MED ORDER — AMLODIPINE BESYLATE 5 MG PO TABS: 5.0000 mg | ORAL_TABLET | Freq: Every day | ORAL | 2 refills | Status: DC

## 2018-05-31 NOTE — Progress Notes (Signed)
Pre visit review using our clinic tool,if applicable. No additional management support is needed unless otherwise documented below in the visit note.   Pt here for Blood pressure check per   Pt currently takes: Losartan 100-12.5 mg    Pt reports compliance with medication.  BP today  = 153/78 HR = 78  Pt advised per Sandford CrazeMelissa O'Sullivan continue Losrtan 100-12.5 mg add Amlodipine 5 mg daily #30 with 2 RF. BMET today. Patient advised and agreed.

## 2018-05-31 NOTE — Addendum Note (Signed)
Addended by: Harley AltoPRICE, Jessie Schrieber M on: 05/31/2018 02:19 PM   Modules accepted: Orders

## 2018-06-01 ENCOUNTER — Encounter: Payer: Self-pay | Admitting: Family

## 2018-06-01 LAB — BASIC METABOLIC PANEL
BUN/Creatinine Ratio: 24 (calc) — ABNORMAL HIGH (ref 6–22)
BUN: 23 mg/dL (ref 7–25)
CO2: 27 mmol/L (ref 20–32)
Calcium: 9.1 mg/dL (ref 8.6–10.4)
Chloride: 105 mmol/L (ref 98–110)
Creat: 0.96 mg/dL — ABNORMAL HIGH (ref 0.60–0.88)
GLUCOSE: 96 mg/dL (ref 65–99)
Potassium: 4.1 mmol/L (ref 3.5–5.3)
Sodium: 142 mmol/L (ref 135–146)

## 2018-06-14 ENCOUNTER — Other Ambulatory Visit: Payer: Self-pay | Admitting: Family

## 2018-06-14 NOTE — Telephone Encounter (Signed)
Dr Drue NovelPaz -- Please see refill protocol failure and advise in PCP's absence? Thank You!

## 2018-07-29 ENCOUNTER — Ambulatory Visit: Payer: Federal, State, Local not specified - PPO | Admitting: Family

## 2018-07-29 ENCOUNTER — Ambulatory Visit (HOSPITAL_BASED_OUTPATIENT_CLINIC_OR_DEPARTMENT_OTHER)
Admission: RE | Admit: 2018-07-29 | Discharge: 2018-07-29 | Disposition: A | Discharge status: Home / Self Care | Payer: Federal, State, Local not specified - PPO | Source: Ambulatory Visit | Attending: Family | Admitting: Family

## 2018-07-29 ENCOUNTER — Encounter: Payer: Self-pay | Admitting: Family

## 2018-07-29 VITALS — BP 180/90 | HR 83 | Temp 98.6°F | Resp 16 | Ht 63.0 in | Wt 163.0 lb

## 2018-07-29 DIAGNOSIS — I10 Essential (primary) hypertension: Secondary | ICD-10-CM

## 2018-07-29 DIAGNOSIS — D509 Iron deficiency anemia, unspecified: Secondary | ICD-10-CM

## 2018-07-29 DIAGNOSIS — M25572 Pain in left ankle and joints of left foot: Secondary | ICD-10-CM | POA: Insufficient documentation

## 2018-07-29 LAB — URIC ACID: URIC ACID, SERUM: 4.6 mg/dL (ref 2.4–7.0)

## 2018-07-29 MED ORDER — METOPROLOL SUCCINATE ER 25 MG PO TB24: 25.0000 mg | ORAL_TABLET | Freq: Every day | ORAL | 3 refills | Status: DC

## 2018-07-29 NOTE — Patient Instructions (Addendum)
Please complete lab work prior to leaving. Complete x-ray on the first floor.  

## 2018-07-29 NOTE — Progress Notes (Signed)
Subjective:    Patient ID: Brandy Stevens, female    DOB: 09/09/1935, 83 y.o.   MRN: [id number]  HPI   Patient is an 83 yr old female who presents today with chief complaint of left sided ankle pain. Reports that symptoms began about 2 months ago and are intermittent. Reports that she did twist her ankle over the summer.    HTN- BP medications include amlodipine 5mg , and hyzaar 100-12.5.    BP Readings from Last 3 Encounters:  07/29/18 (!) 173/64  05/17/18 (!) 163/105  05/10/18 (!) 146/78   Iron deficiency anemia- reports that she taking  Lab Results  Component Value Date   WBC 5.9 04/15/2018   HGB 12.1 04/15/2018   HCT 40.5 04/15/2018   MCV 90.0 04/15/2018   PLT 230 04/15/2018     Review of Systems See HPI  Past Medical History:  Diagnosis Date  . Allergy    allergic rhinitis  . Anemia    nos  . History of chicken pox   . Hypertension   . Systolic murmur    Normal valves on 2-d echo 5/11     Social History   Socioeconomic History  . Marital status: Widowed    Spouse name: Not on file  . Number of children: 1  . Years of education: Not on file  . Highest education level: Not on file  Occupational History  . Occupation: Academic librarianURSE    Employer: [employer]  Social Needs  . Financial resource strain: Not on file  . Food insecurity:    Worry: Not on file    Inability: Not on file  . Transportation needs:    Medical: Not on file    Non-medical: Not on file  Tobacco Use  . Smoking status: Never Smoker  . Smokeless tobacco: Never Used  Substance and Sexual Activity  . Alcohol use: No  . Drug use: No  . Sexual activity: Not on file  Lifestyle  . Physical activity:    Days per week: Not on file    Minutes per session: Not on file  . Stress: Not on file  Relationships  . Social connections:    Talks on phone: Not on file    Gets together: Not on file    Attends religious service: Not on file    Active member of club or organization: Not on  file    Attends meetings of clubs or organizations: Not on file    Relationship status: Not on file  . Intimate partner violence:    Fear of current or ex partner: Not on file    Emotionally abused: Not on file    Physically abused: Not on file    Forced sexual activity: Not on file  Other Topics Concern  . Not on file  Social History Narrative   7 brothers-- 1 died from unknown cause, 1 died from pneumonia. 5 still living.   1 son living- lives locally, he has a son who lives locally-    One great granddaughter   Widowed, husband died in 2007   Has two outdoor   Enjoys volunteering, Market researchervoting volunteer   Volunteers for shopping trips at [employer]    Past Surgical History:  Procedure Laterality Date  . RIGHT OOPHORECTOMY  1968    Family History  Problem Relation Age of Onset  . Other Mother        complications from hip fx  . Pneumonia Father   . Osteoarthritis Brother   .  Actinic keratosis Brother     Allergies  Allergen Reactions  . Ace Inhibitors     cough    Current Outpatient Medications on File Prior to Visit  Medication Sig Dispense Refill  . amLODipine (NORVASC) 5 MG tablet Take 1 tablet (5 mg total) by mouth daily. 30 tablet 2  . Calcium Carbonate-Vitamin D (CALTRATE 600+D) 600-400 MG-UNIT per tablet Take 1 tablet by mouth 2 (two) times daily.    . cholecalciferol (VITAMIN D) 1000 UNITS tablet Take 2,000 Units by mouth daily.    Marland Kitchen docusate sodium (COLACE) 100 MG capsule Take 1 capsule by mouth 1 to 2 times daily as needed.    . ferrous sulfate (FERROUSUL) 325 (65 FE) MG tablet Take 325 mg by mouth daily with breakfast.     . losartan-hydrochlorothiazide (HYZAAR) 100-12.5 MG tablet Take 1 tablet by mouth daily. 90 tablet 0  . Omega-3 Fatty Acids (FISH OIL) 1000 MG CAPS Take 1 capsule by mouth daily. Currently takes it once a week in the summer.    . [DISCONTINUED] lisinopril (PRINIVIL,ZESTRIL) 10 MG tablet Take 1 tablet (10 mg total) by mouth daily. 30 tablet 3     No current facility-administered medications on file prior to visit.     BP (!) 173/64 (BP Location: Right Arm, Patient Position: Sitting, Cuff Size: Small)   Pulse 83   Temp 98.6 F (37 C) (Oral)   Resp 16   Ht 5\' 3"  (1.6 m)   Wt 163 lb (73.9 kg)   SpO2 99%   BMI 28.87 kg/m       Objective:   Physical Exam Constitutional:      Appearance: She is well-developed.  Neck:     Musculoskeletal: Neck supple.     Thyroid: No thyromegaly.  Cardiovascular:     Rate and Rhythm: Normal rate and regular rhythm.     Heart sounds: Normal heart sounds. No murmur.  Pulmonary:     Effort: Pulmonary effort is normal. No respiratory distress.     Breath sounds: Normal breath sounds. No wheezing.  Musculoskeletal:     Comments: + swelling of left ankle and lower shin   Skin:    General: Skin is warm and dry.  Neurological:     Mental Status: She is alert and oriented to person, place, and time.  Psychiatric:        Behavior: Behavior normal.        Thought Content: Thought content normal.        Judgment: Judgment normal.           Assessment & Plan:   Left ankle pain- obtain x-ray and uric acid to rule out gout.  HTN- uncontrolled. Add toprol xl.  Iron deficiency anemia- blood count and iron level are stable. Continue iron supplement.

## 2018-07-30 ENCOUNTER — Telehealth: Payer: Self-pay | Admitting: Family

## 2018-07-30 NOTE — Telephone Encounter (Signed)
Please contact pt and let her know that gout test is negative. X-ray shows an old fracture of inner ankle which is healed. Note is also made of bone spur.

## 2018-07-31 NOTE — Telephone Encounter (Signed)
Results given to patient

## 2018-08-27 ENCOUNTER — Ambulatory Visit: Payer: Federal, State, Local not specified - PPO | Admitting: Family

## 2018-08-28 ENCOUNTER — Ambulatory Visit: Payer: Federal, State, Local not specified - PPO | Admitting: Family

## 2018-08-28 ENCOUNTER — Encounter: Payer: Self-pay | Admitting: Family

## 2018-08-28 VITALS — BP 164/69 | HR 82 | Temp 98.2°F | Resp 16 | Ht 63.0 in | Wt 162.6 lb

## 2018-08-28 DIAGNOSIS — M25472 Effusion, left ankle: Secondary | ICD-10-CM | POA: Diagnosis not present

## 2018-08-28 DIAGNOSIS — I10 Essential (primary) hypertension: Secondary | ICD-10-CM

## 2018-08-28 MED ORDER — METOPROLOL SUCCINATE ER 50 MG PO TB24: 50.0000 mg | ORAL_TABLET | Freq: Every day | ORAL | 0 refills | Status: DC

## 2018-08-28 MED FILL — METOPROLOL SUCCINATE ER 50: 50 | 90 days supply | Qty: 90 | Fill #0

## 2018-08-28 NOTE — Progress Notes (Signed)
Subjective:    Patient ID: Brandy Stevens, female    DOB: December 03, 1935, 83 y.o.   MRN: [id number]  HPI  Patient is an 83 year old female who presents today for follow-up.  Hypertension-last visit blood pressure was noted to be elevated.  We added Toprol-XL 25 mg once daily to her regimen.  She was maintained on losartan hydrochlorothiazide and amlodipine 5 mg. Reports that her home readings have been ranging 150-160 at home.  BP Readings from Last 3 Encounters:  08/28/18 (!) 164/69  07/29/18 (!) 180/90  05/17/18 (!) 163/105   Left ankle swelling- last visit we obtained a uric acid level which was normal and 4.6.  An x-ray was performed which noted a remote medial malleolus avulsion injury.  Review of Systems   See HPI  Past Medical History:  Diagnosis Date  . Allergy    allergic rhinitis  . Anemia    nos  . History of chicken pox   . Hypertension   . Systolic murmur    Normal valves on 2-d echo 5/11     Social History   Socioeconomic History  . Marital status: Widowed    Spouse name: Not on file  . Number of children: 1  . Years of education: Not on file  . Highest education level: Not on file  Occupational History  . Occupation: Academic librarian: [employer]  Social Needs  . Financial resource strain: Not on file  . Food insecurity:    Worry: Not on file    Inability: Not on file  . Transportation needs:    Medical: Not on file    Non-medical: Not on file  Tobacco Use  . Smoking status: Never Smoker  . Smokeless tobacco: Never Used  Substance and Sexual Activity  . Alcohol use: No  . Drug use: No  . Sexual activity: Not on file  Lifestyle  . Physical activity:    Days per week: Not on file    Minutes per session: Not on file  . Stress: Not on file  Relationships  . Social connections:    Talks on phone: Not on file    Gets together: Not on file    Attends religious service: Not on file    Active member of club or organization: Not on  file    Attends meetings of clubs or organizations: Not on file    Relationship status: Not on file  . Intimate partner violence:    Fear of current or ex partner: Not on file    Emotionally abused: Not on file    Physically abused: Not on file    Forced sexual activity: Not on file  Other Topics Concern  . Not on file  Social History Narrative   7 brothers-- 1 died from unknown cause, 1 died from pneumonia. 5 still living.   1 son living- lives locally, he has a son who lives locally-    One great granddaughter   Widowed, husband died in Nov 02, 2005   Has two outdoor   Enjoys volunteering, Market researcher for shopping trips at [employer]    Past Surgical History:  Procedure Laterality Date  . RIGHT OOPHORECTOMY  1968    Family History  Problem Relation Age of Onset  . Other Mother        complications from hip fx  . Pneumonia Father   . Osteoarthritis Brother   . Actinic keratosis Brother     Allergies  Allergen Reactions  . Ace Inhibitors     cough    Current Outpatient Medications on File Prior to Visit  Medication Sig Dispense Refill  . amLODipine (NORVASC) 5 MG tablet Take 1 tablet (5 mg total) by mouth daily. 30 tablet 2  . Calcium Carbonate-Vitamin D (CALTRATE 600+D) 600-400 MG-UNIT per tablet Take 1 tablet by mouth 2 (two) times daily.    . cholecalciferol (VITAMIN D) 1000 UNITS tablet Take 2,000 Units by mouth daily.    Marland Kitchen docusate sodium (COLACE) 100 MG capsule Take 1 capsule by mouth 1 to 2 times daily as needed.    . ferrous sulfate (FERROUSUL) 325 (65 FE) MG tablet Take 325 mg by mouth daily with breakfast.     . losartan-hydrochlorothiazide (HYZAAR) 100-12.5 MG tablet Take 1 tablet by mouth daily. 90 tablet 0  . metoprolol succinate (TOPROL-XL) 25 MG 24 hr tablet Take 1 tablet (25 mg total) by mouth daily. 30 tablet 3  . Omega-3 Fatty Acids (FISH OIL) 1000 MG CAPS Take 1 capsule by mouth daily. Currently takes it once a week in the summer.    .  [DISCONTINUED] lisinopril (PRINIVIL,ZESTRIL) 10 MG tablet Take 1 tablet (10 mg total) by mouth daily. 30 tablet 3   No current facility-administered medications on file prior to visit.     BP (!) 164/69 (BP Location: Right Arm, Patient Position: Sitting, Cuff Size: Small)   Pulse 82   Temp 98.2 F (36.8 C) (Oral)   Resp 16   Ht 5\' 3"  (1.6 m)   Wt 162 lb 9.6 oz (73.8 kg)   SpO2 100%   BMI 28.80 kg/m       Objective:   Physical Exam Constitutional:      Appearance: She is well-developed.  Neck:     Musculoskeletal: Neck supple.     Thyroid: No thyromegaly.  Cardiovascular:     Rate and Rhythm: Normal rate and regular rhythm.     Heart sounds: Normal heart sounds. No murmur.  Pulmonary:     Effort: Pulmonary effort is normal. No respiratory distress.     Breath sounds: Normal breath sounds. No wheezing.  Musculoskeletal:     Right lower leg: 2+ Edema present.     Left lower leg: 3+ Edema present.  Skin:    General: Skin is warm and dry.  Neurological:     Mental Status: She is alert and oriented to person, place, and time.  Psychiatric:        Behavior: Behavior normal.        Thought Content: Thought content normal.        Judgment: Judgment normal.           Assessment & Plan:  HTN- improved but still above the goal.  Will increase toprol xl from 25mg  to 50mg .  L ankle swelling- likely related to previous fracture as well as amlodipine. Suggested that she wear support stockings.

## 2018-08-28 NOTE — Patient Instructions (Signed)
Please increase toprol xl from 25 mg to 50mg once daily. 

## 2018-09-11 ENCOUNTER — Ambulatory Visit (INDEPENDENT_AMBULATORY_CARE_PROVIDER_SITE_OTHER): Payer: Federal, State, Local not specified - PPO | Admitting: Medical

## 2018-09-11 ENCOUNTER — Other Ambulatory Visit: Payer: Self-pay

## 2018-09-11 VITALS — BP 147/76 | HR 76

## 2018-09-11 DIAGNOSIS — I10 Essential (primary) hypertension: Secondary | ICD-10-CM

## 2018-09-11 NOTE — Progress Notes (Signed)
Pre visit review using our clinic tool,if applicable. No additional management support is needed unless otherwise documented below in the visit note.  Pt here for Blood pressure check per   Pt currently takes: Toprol 50 mg, Amlodipine 5 mg,Losartan -Hctz-100-12.5 mg   Pt reports compliance with medication.  BP today  = 147/76 HR = 76  Pt advised per E. Saguier,PA-C increase Toprol to 100 mg daily. Take all other medications as ordered. Return for BP check in 7-10 days. Take BP at home and call office if pulse falls below 60. Patient agreed.  Appointment scheduled.  This is advise I gave.  Esperanza Richters, PA-C

## 2018-09-17 ENCOUNTER — Telehealth: Payer: Self-pay | Admitting: Family

## 2018-09-17 NOTE — Telephone Encounter (Signed)
Copied from CRM [id number]. Topic: General - Other >> Sep 17, 2018 11:26 AM Leafy Ro wrote: Reason for CRM: pt left voice message on refill line  on 09-13-2018 asking [facility] or someone to return her call concerning her bp. Pt bp today is 120/59 and pulse 61. Pt has an appt 09-18-2018 for bp check.

## 2018-09-18 ENCOUNTER — Ambulatory Visit (INDEPENDENT_AMBULATORY_CARE_PROVIDER_SITE_OTHER): Payer: Federal, State, Local not specified - PPO | Admitting: Family

## 2018-09-18 ENCOUNTER — Other Ambulatory Visit: Payer: Self-pay

## 2018-09-18 VITALS — BP 136/75 | HR 74

## 2018-09-18 DIAGNOSIS — I10 Essential (primary) hypertension: Secondary | ICD-10-CM

## 2018-09-18 NOTE — Progress Notes (Signed)
Pre visit review using our clinic tool,if applicable. No additional management support is needed unless otherwise documented below in the visit note.  Pt here for Blood pressure check per order from E. Saguier,PA-C dated 09/11/18  Pt currently takes: Toprol 100 mg, Amlodipine 5 mg, Losartan-Hctz 100-12.5 mg.      Pt reports compliance with medication however she had low pulses running 54-59   one day and she held medication. States she called in to office to inform but did not get answer so she did not try again.  BP today  = 136/75 HR =74  Pt advised per Rachel Bo to continue medications as ordered. Return for OV in 3 months. Patient has appointment scheduled.

## 2018-09-18 NOTE — Progress Notes (Signed)
LPN note reviewed and agree.  Kiah Vanalstine S O'Sullivan NP 

## 2018-10-17 ENCOUNTER — Other Ambulatory Visit: Payer: Self-pay | Admitting: Family

## 2018-10-17 MED ORDER — AMLODIPINE BESYLATE 5 MG PO TABS: 5.0000 mg | ORAL_TABLET | Freq: Every day | ORAL | 0 refills | Status: DC

## 2018-10-17 NOTE — Telephone Encounter (Signed)
Requested Prescriptions  Pending Prescriptions Disp Refills  . amLODipine (NORVASC) 5 MG tablet 90 tablet 0    Sig: Take 1 tablet (5 mg total) by mouth daily.     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, [place], [place]   5 months ago Essential hypertension   Holiday representative at [facility], [place], [place]   5 months ago Essential hypertension   Holiday representative at [facility], [place], [place]      Future Appointments            In 1 month Sandford Craze, NP [employer] at [facility], [place]

## 2018-11-29 ENCOUNTER — Ambulatory Visit: Payer: Federal, State, Local not specified - PPO | Admitting: Family

## 2019-01-15 ENCOUNTER — Telehealth: Payer: Self-pay | Admitting: Family

## 2019-01-15 NOTE — Telephone Encounter (Signed)
Called pt left msg for her to call back for an app per CRM

## 2019-01-20 ENCOUNTER — Other Ambulatory Visit: Payer: Self-pay

## 2019-01-20 ENCOUNTER — Ambulatory Visit (INDEPENDENT_AMBULATORY_CARE_PROVIDER_SITE_OTHER): Payer: Federal, State, Local not specified - PPO | Admitting: Family

## 2019-01-20 DIAGNOSIS — B0229 Other postherpetic nervous system involvement: Secondary | ICD-10-CM | POA: Diagnosis not present

## 2019-01-20 MED ORDER — GABAPENTIN 300 MG PO CAPS: 300.0000 mg | ORAL_CAPSULE | Freq: Every day | ORAL | 3 refills | Status: DC

## 2019-01-20 NOTE — Progress Notes (Signed)
Virtual Visit via Video Note  I connected with Brandy Stevens on 01/20/19 at  8:00 AM EDT by a video enabled telemedicine application and verified that I am speaking with the correct person using two identifiers.  Location: Patient: home Provider: home   I discussed the limitations of evaluation and management by telemedicine and the availability of in person appointments. The patient expressed understanding and agreed to proceed.  History of Present Illness:  Patient is an 83 yr old female who presents with chief complaint of which began 1 week ago. Rash is located on the right lower extremity. Patient reports that she developed red areas in the right inner knee, back of thigh. Then began to get blisters.  Reports that the areas are now "drying up now." except for an area on the back of her calf.  Now however, she is having pain in the areas where the rash was located.  Also, having pain in the right groin area. Pain is making it difficult for her to sleep at night but during the day is less severe.    Reports that she purchased a "salve for eczema and one for shingles."  Reports that she still has pain at night.  Reports that the first day that she noticed the rash was last Monday.   Past Medical History:  Diagnosis Date  . Allergy    allergic rhinitis  . Anemia    nos  . History of chicken pox   . Hypertension   . Systolic murmur    Normal valves on 2-d echo 5/11     Social History   Socioeconomic History  . Marital status: Widowed    Spouse name: Not on file  . Number of children: 1  . Years of education: Not on file  . Highest education level: Not on file  Occupational History  . Occupation: Academic librarianURSE    Employer: [employer]  Social Needs  . Financial resource strain: Not on file  . Food insecurity    Worry: Not on file    Inability: Not on file  . Transportation needs    Medical: Not on file    Non-medical: Not on file  Tobacco Use  . Smoking status:  Never Smoker  . Smokeless tobacco: Never Used  Substance and Sexual Activity  . Alcohol use: No  . Drug use: No  . Sexual activity: Not on file  Lifestyle  . Physical activity    Days per week: Not on file    Minutes per session: Not on file  . Stress: Not on file  Relationships  . Social Musicianconnections    Talks on phone: Not on file    Gets together: Not on file    Attends religious service: Not on file    Active member of club or organization: Not on file    Attends meetings of clubs or organizations: Not on file    Relationship status: Not on file  . Intimate partner violence    Fear of current or ex partner: Not on file    Emotionally abused: Not on file    Physically abused: Not on file    Forced sexual activity: Not on file  Other Topics Concern  . Not on file  Social History Narrative   7 brothers-- 1 died from unknown cause, 1 died from pneumonia. 5 still living.   1 son living- lives locally, he has a son who lives locally-    One great granddaughter  Widowed, husband died in 2007   Has two outdoor   Enjoys volunteering, Market researchervoting volunteer   Volunteers for shopping trips at [employer]    Past Surgical History:  Procedure Laterality Date  . RIGHT OOPHORECTOMY  1968    Family History  Problem Relation Age of Onset  . Other Mother        complications from hip fx  . Pneumonia Father   . Osteoarthritis Brother   . Actinic keratosis Brother     Allergies  Allergen Reactions  . Ace Inhibitors     cough    Current Outpatient Medications on File Prior to Visit  Medication Sig Dispense Refill  . amLODipine (NORVASC) 5 MG tablet Take 1 tablet (5 mg total) by mouth daily. 90 tablet 0  . Calcium Carbonate-Vitamin D (CALTRATE 600+D) 600-400 MG-UNIT per tablet Take 1 tablet by mouth 2 (two) times daily.    . cholecalciferol (VITAMIN D) 1000 UNITS tablet Take 2,000 Units by mouth daily.    Marland Kitchen. docusate sodium (COLACE) 100 MG capsule Take 1 capsule by mouth 1 to 2 times  daily as needed.    . ferrous sulfate (FERROUSUL) 325 (65 FE) MG tablet Take 325 mg by mouth daily with breakfast.     . losartan-hydrochlorothiazide (HYZAAR) 100-12.5 MG tablet Take 1 tablet by mouth daily. 90 tablet 0  . metoprolol succinate (TOPROL-XL) 50 MG 24 hr tablet Take 1 tablet (50 mg total) by mouth daily. Take with or immediately following a meal. 90 tablet 0  . Omega-3 Fatty Acids (FISH OIL) 1000 MG CAPS Take 1 capsule by mouth daily. Currently takes it once a week in the summer.    . [DISCONTINUED] lisinopril (PRINIVIL,ZESTRIL) 10 MG tablet Take 1 tablet (10 mg total) by mouth daily. 30 tablet 3   No current facility-administered medications on file prior to visit.     There were no vitals taken for this visit.     Observations/Objective:  Virtual Visit via Video Note  I connected with Brandy Boxarolyn Y Ng on 01/20/19 at  8:00 AM EDT by a video enabled telemedicine application and verified that I am speaking with the correct person using two identifiers.  Location: Patient: home Provider: home   I discussed the limitations of evaluation and management by telemedicine and the availability of in person appointments. The patient expressed understanding and agreed to proceed.  History of Present Illness:    Observations/Objective:   Gen: Awake, alert, no acute distress Resp: Breathing is even and non-labored Psych: calm/pleasant demeanor Neuro: Alert and Oriented x 3,  speech is clear.   Assessment and Plan:  Neuropathic pain- most likely secondary to resolving shingles infection. Will give trial of gabapentin QHS. Plan follow up in office in 2 weeks.    Follow Up Instructions:    I discussed the assessment and treatment plan with the patient. The patient was provided an opportunity to ask questions and all were answered. The patient agreed with the plan and demonstrated an understanding of the instructions.   The patient was advised to call back or seek an  in-person evaluation if the symptoms worsen or if the condition fails to improve as anticipated.  Lemont FillersMelissa S O'Sullivan, NP     Assessment and Plan:  Post-neuralgia- history/symptoms most consistent with herpetic neuralgia in an L2 or L3 distribution on the left following Herpes zoster.  Outside window for valtrex. Will give trial of HS gabapentin. Follow up in office in 2 weeks.   Follow Up Instructions:  I discussed the assessment and treatment plan with the patient. The patient was provided an opportunity to ask questions and all were answered. The patient agreed with the plan and demonstrated an understanding of the instructions.   The patient was advised to call back or seek an in-person evaluation if the symptoms worsen or if the condition fails to improve as anticipated.  I provided  9 minutes of non-face-to-face time during this encounter.   Nance Pear, NP

## 2019-01-20 NOTE — Patient Instructions (Signed)
Please begin gabapentin 1 hour before bed for nerve pain.

## 2019-01-29 ENCOUNTER — Ambulatory Visit: Payer: Federal, State, Local not specified - PPO | Admitting: Family

## 2019-06-18 ENCOUNTER — Ambulatory Visit (HOSPITAL_BASED_OUTPATIENT_CLINIC_OR_DEPARTMENT_OTHER)
Admission: RE | Admit: 2019-06-18 | Discharge: 2019-06-18 | Disposition: A | Discharge status: Home / Self Care | Payer: Federal, State, Local not specified - PPO | Source: Ambulatory Visit | Attending: Internal Medicine | Admitting: Internal Medicine

## 2019-06-18 ENCOUNTER — Encounter: Payer: Self-pay | Admitting: Internal Medicine

## 2019-06-18 ENCOUNTER — Ambulatory Visit: Payer: Federal, State, Local not specified - PPO | Admitting: Internal Medicine

## 2019-06-18 ENCOUNTER — Other Ambulatory Visit: Payer: Self-pay

## 2019-06-18 ENCOUNTER — Ambulatory Visit: Payer: Self-pay | Admitting: *Deleted

## 2019-06-18 VITALS — BP 221/87 | HR 82 | Temp 95.3°F | Resp 16 | Ht 63.0 in | Wt 162.1 lb

## 2019-06-18 DIAGNOSIS — M79661 Pain in right lower leg: Secondary | ICD-10-CM | POA: Insufficient documentation

## 2019-06-18 DIAGNOSIS — I10 Essential (primary) hypertension: Secondary | ICD-10-CM

## 2019-06-18 DIAGNOSIS — M7989 Other specified soft tissue disorders: Secondary | ICD-10-CM

## 2019-06-18 DIAGNOSIS — S93401A Sprain of unspecified ligament of right ankle, initial encounter: Secondary | ICD-10-CM

## 2019-06-18 DIAGNOSIS — W19XXXA Unspecified fall, initial encounter: Secondary | ICD-10-CM

## 2019-06-18 NOTE — Progress Notes (Signed)
Pre visit review using our clinic review tool, if applicable. No additional management support is needed unless otherwise documented below in the visit note. 

## 2019-06-18 NOTE — Progress Notes (Signed)
Subjective:    Patient ID: Brandy Stevens, female    DOB: 1936/03/04, 83 y.o.   MRN: [id number]  DOS:  06/18/2019 Type of visit - description: Acute The patient had a fall 4 days ago, she was going down her stairs, her foot slipped, she landed on her buttocks, on her way she thinks she twisted the R ankle and injury to her R calf. Has developed right ankle pain with ambulation Has developed some swelling and tenderness on the calf.  Other than that she feels well.  Her BP was noted to be quite elevated   Review of Systems Denies any neck or head injury.  No headache No LOC No hip pain No history of  DVTs As far as her elevated blood pressure, she has no symptoms: No chest pain, difficulty breathing, headache.  Past Medical History:  Diagnosis Date  . Allergy    allergic rhinitis  . Anemia    nos  . History of chicken pox   . Hypertension   . Systolic murmur    Normal valves on 2-d echo 5/11    Past Surgical History:  Procedure Laterality Date  . RIGHT OOPHORECTOMY  1968    Social History   Socioeconomic History  . Marital status: Widowed    Spouse name: Not on file  . Number of children: 1  . Years of education: Not on file  . Highest education level: Not on file  Occupational History  . Occupation: Optician, dispensing: [employer]  Tobacco Use  . Smoking status: Never Smoker  . Smokeless tobacco: Never Used  Substance and Sexual Activity  . Alcohol use: No  . Drug use: No  . Sexual activity: Not on file  Other Topics Concern  . Not on file  Social History Narrative   7 brothers-- 1 died from unknown cause, 1 died from pneumonia. 5 still living.   1 son living- lives locally, he has a son who lives locally-    One great granddaughter   Widowed, husband died in 10-24-2005   Has two outdoor   Enjoys volunteering, Photographer for shopping trips at [employer] Strain:   .  Difficulty of Paying Living Expenses: Not on file  Food Insecurity:   . Worried About Charity fundraiser in the Last Year: Not on file  . Ran Out of Food in the Last Year: Not on file  Transportation Needs:   . Lack of Transportation (Medical): Not on file  . Lack of Transportation (Non-Medical): Not on file  Physical Activity:   . Days of Exercise per Week: Not on file  . Minutes of Exercise per Session: Not on file  Stress:   . Feeling of Stress : Not on file  Social Connections:   . Frequency of Communication with Friends and Family: Not on file  . Frequency of Social Gatherings with Friends and Family: Not on file  . Attends Religious Services: Not on file  . Active Member of Clubs or Organizations: Not on file  . Attends Archivist Meetings: Not on file  . Marital Status: Not on file  Intimate Partner Violence:   . Fear of Current or Ex-Partner: Not on file  . Emotionally Abused: Not on file  . Physically Abused: Not on file  . Sexually Abused: Not on file      Allergies as of 06/18/2019  Reactions   Ace Inhibitors    cough      Medication List       Accurate as of June 18, 2019  3:06 PM. If you have any questions, ask your nurse or doctor.        amLODipine 5 MG tablet Commonly known as: NORVASC Take 1 tablet (5 mg total) by mouth daily.   Caltrate 600+D 600-400 MG-UNIT tablet Generic drug: Calcium Carbonate-Vitamin D Take 1 tablet by mouth 2 (two) times daily.   cholecalciferol 1000 units tablet Commonly known as: VITAMIN D Take 2,000 Units by mouth daily.   docusate sodium 100 MG capsule Commonly known as: COLACE Take 1 capsule by mouth 1 to 2 times daily as needed.   FerrouSul 325 (65 FE) MG tablet Generic drug: ferrous sulfate Take 325 mg by mouth daily with breakfast.   Fish Oil 1000 MG Caps Take 1 capsule by mouth daily. Currently takes it once a week in the summer.   gabapentin 300 MG capsule Commonly known as:  NEURONTIN Take 1 capsule (300 mg total) by mouth at bedtime.   lisinopril 10 MG tablet Commonly known as: ZESTRIL Take 10 mg by mouth daily.   losartan-hydrochlorothiazide 100-12.5 MG tablet Commonly known as: HYZAAR Take 1 tablet by mouth daily.   metoprolol succinate 50 MG 24 hr tablet Commonly known as: TOPROL-XL Take 1 tablet (50 mg total) by mouth daily. Take with or immediately following a meal.           Objective:   Physical Exam BP (!) 210/80 (BP Location: Left Arm, Patient Position: Sitting, Cuff Size: Small)   Pulse 80   Temp (!) 95.3 F (35.2 C) (Temporal)   Resp 16   Ht 5\' 3"  (1.6 m)   Wt 162 lb 2 oz (73.5 kg)   SpO2 100%   BMI 28.72 kg/m  General:   Well developed, NAD, BMI noted. HEENT:  Normocephalic . Face symmetric, atraumatic MSK: Left ankle normal Right ankle: No swelling, slightly TTP mostly anteriorly, no deformity.  Passive range of motion minimally limited compared to the left. Right calf: No swelling, but has mild tenderness.  He does have very mild edema at the distal pretibial area. Skin: Not pale. Not jaundice Neurologic:  alert & oriented X3.  Speech normal, gait appropriate for age and unassisted Psych--  Cognition and judgment appear intact.  Cooperative with normal attention span and concentration.  Behavior appropriate. No anxious or depressed appearing.      Assessment    83 year old female, PMH includes cardiomyopathy, hypertension and history of whitecoat syndrome, presents with  Fall: Initial encounter, no major injury except for ankle. Ankle sprain: Suspect a sprain on clinical grounds, will get a x-ray, recommend leg elevation, Tylenol, ice, call if no better. Calf pain: Likely due to the injury but to be sure we will get ultrasound. Hypertension: BP today is quite elevated, recheck confirm BP elevation.  The patient is asymptomatic and reports whitecoat syndrome.  On chart review previous BPs has not been as  high. Recommend to check BP tonight and tomorrow and call early in the morning if it remains elevated.  Patient verbalized understanding.   This visit occurred during the SARS-CoV-2 public health emergency.  Safety protocols were in place, including screening questions prior to the visit, additional usage of staff PPE, and extensive cleaning of exam room while observing appropriate contact time as indicated for disinfecting solutions.

## 2019-06-18 NOTE — Telephone Encounter (Signed)
Pt called stating she fell on 06/15/2019 and injured her right ankle; she hit her right leg (back of her calf) on the end of her steps; the pt says she put on a support hose; she says that she has the sensation that her ankle is going to give way; her calf is swollen; elevation lessens the swelling; she would like an X-ray; recommendations made per nurse triage protocol; she verbalized understanding; the pt sees Debbrah Alar, [place]; pt transferred to [facility] for scheduling.   Reason for Disposition . Large swelling or bruise > 2 inches (5 cm)  Answer Assessment - Initial Assessment Questions 1. MECHANISM: "How did the injury happen?" (e.g., twisting injury, direct blow)     Pt fell going down the steps 2. ONSET: "When did the injury happen?" (Minutes or hours ago)     Pt hit leg when she fell 3. LOCATION: "Where is the injury located?"     Right leg 4. APPEARANCE of INJURY: "What does the injury look like?"  (e.g., deformity of leg)     no 5. SEVERITY: "Can you put weight on that leg?" "Can you walk?"      Painful to walk 6. SIZE: For cuts, bruises, or swelling, ask: "How large is it?" (e.g., inches or centimeters)   pt can not visualize area 7. PAIN: "Is there pain?" If so, ask: "How bad is the pain?"  (Scale 1-10; or mild, moderate, severe)    Rated 8 out of 10 when trying to walk 8. TETANUS: For any breaks in the skin, ask: "When was the last tetanus booster?"     n/a 9. OTHER SYMPTOMS: "Do you have any other symptoms?"      no 10. PREGNANCY: "Is there any chance you are pregnant?" "When was your no  Protocols used: LEG INJURY-A-AH

## 2019-06-18 NOTE — Patient Instructions (Addendum)
Please go to the first floor:  Get x-ray and ultrasound  Keep the leg elevated  Ice it twice a day until better  Tylenol  500 mg OTC 2 tabs a day every 8 hours as needed for pain  Call if not gradually better Call if symptoms increase  Your blood pressure is very high, please recheck tonight and tomorrow morning.  If is not back or near back to normal (120/80) call the office early in the morning.

## 2019-06-18 NOTE — Telephone Encounter (Signed)
Saw Dr. Larose Kells today

## 2019-09-27 ENCOUNTER — Ambulatory Visit: Payer: Federal, State, Local not specified - PPO | Attending: Internal Medicine

## 2019-09-27 DIAGNOSIS — Z23 Encounter for immunization: Secondary | ICD-10-CM

## 2019-09-27 NOTE — Progress Notes (Signed)
   Covid-19 Vaccination Clinic  Name:  Brandy Stevens    MRN: [id number] DOB: 1936/03/27  09/27/2019  [name] was observed post Covid-19 immunization for 15 minutes without incident. She was provided with Vaccine Information Sheet and instruction to access the V-Safe system.   [name] was instructed to call 911 with any severe reactions post vaccine: Marland Kitchen Difficulty breathing  . Swelling of face and throat  . A fast heartbeat  . A bad rash all over body  . Dizziness and weakness   Immunizations Administered    Name Date Dose VIS Date Route   Pfizer COVID-19 Vaccine 09/27/2019 12:03 PM 0.3 mL 06/06/2019 Intramuscular   Manufacturer: [employer], [employer]   Lot: [id number]   NDC: [address]-8

## 2019-10-21 ENCOUNTER — Ambulatory Visit: Payer: Federal, State, Local not specified - PPO | Attending: Internal Medicine

## 2019-10-21 DIAGNOSIS — Z23 Encounter for immunization: Secondary | ICD-10-CM

## 2019-10-21 NOTE — Progress Notes (Signed)
   Covid-19 Vaccination Clinic  Name:  Brandy Stevens    MRN: [id number] DOB: 05/24/1936  10/21/2019  [name] was observed post Covid-19 immunization for 15 minutes without incident. She was provided with Vaccine Information Sheet and instruction to access the V-Safe system.   [name] was instructed to call 911 with any severe reactions post vaccine: Marland Kitchen Difficulty breathing  . Swelling of face and throat  . A fast heartbeat  . A bad rash all over body  . Dizziness and weakness   Immunizations Administered    Name Date Dose VIS Date Route   Pfizer COVID-19 Vaccine 10/21/2019 10:14 AM 0.3 mL 08/20/2018 Intramuscular   Manufacturer: [employer], [employer]   Lot: [id number]   NDC: [address]-7

## 2020-04-24 ENCOUNTER — Ambulatory Visit: Payer: Federal, State, Local not specified - PPO | Attending: Internal Medicine

## 2020-04-24 ENCOUNTER — Other Ambulatory Visit: Payer: Self-pay

## 2020-04-24 DIAGNOSIS — Z23 Encounter for immunization: Secondary | ICD-10-CM

## 2020-04-24 NOTE — Progress Notes (Signed)
   Covid-19 Vaccination Clinic  Name:  Brandy Stevens    MRN: [id number] DOB: [birth year]  04/24/2020  [name] was observed post Covid-19 immunization for 15 minutes without incident. She was provided with Vaccine Information Sheet and instruction to access the V-Safe system.   [name] was instructed to call 911 with any severe reactions post vaccine: Marland Kitchen Difficulty breathing  . Swelling of face and throat  . A fast heartbeat  . A bad rash all over body  . Dizziness and weakness

## 2020-05-25 IMAGING — US US EXTREM LOW VENOUS*R*
1 series · 13 of 24 positions shown · non-contrast
Comparison: None.

CLINICAL DATA: Right calf pain.



[Series 1: us extrem low venous*right* · 41 acquisitions, 13 frames shown]
[im 1/41]
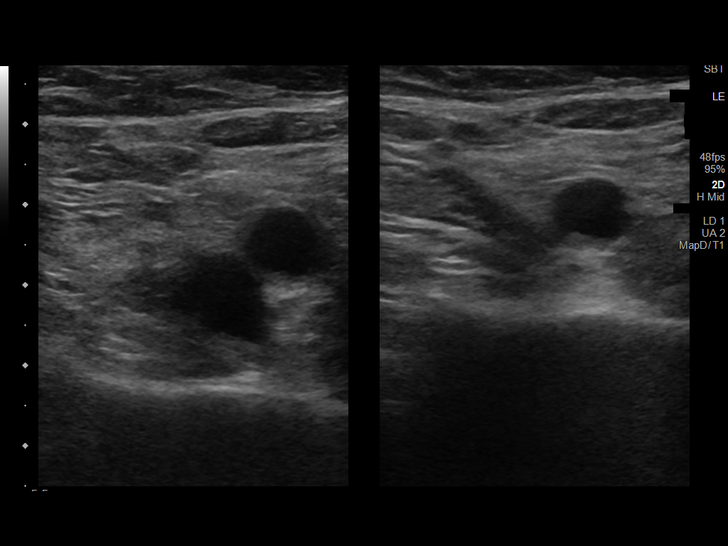
[im 4/41]
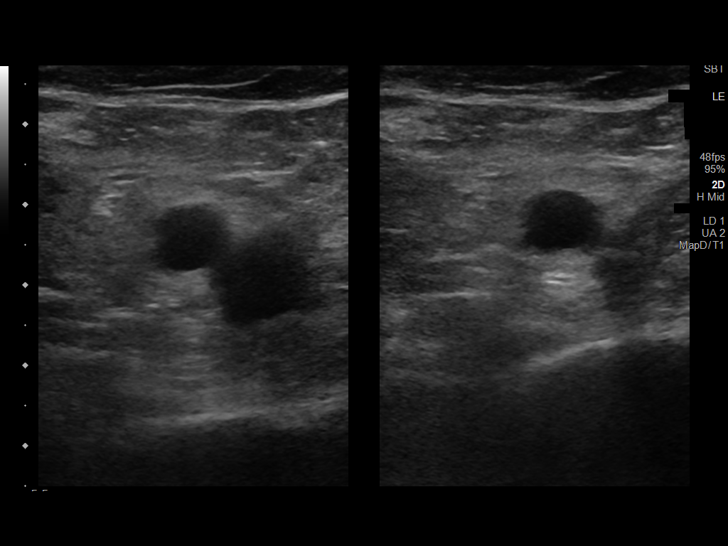
[im 7/41]
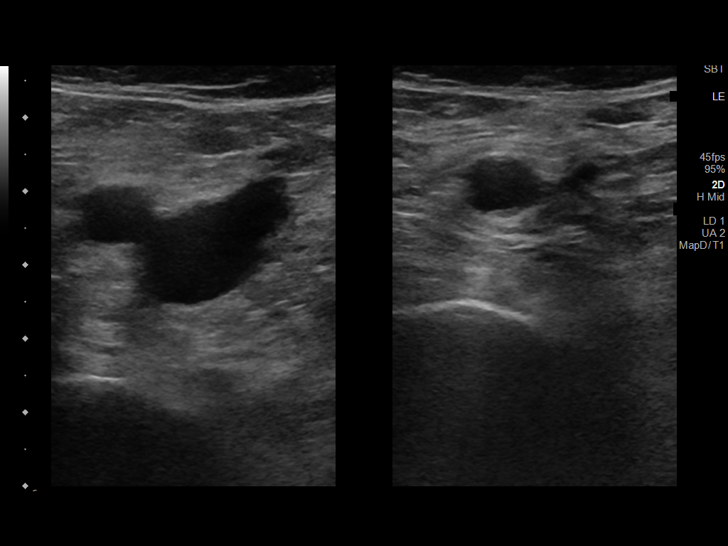
[im 11/41]
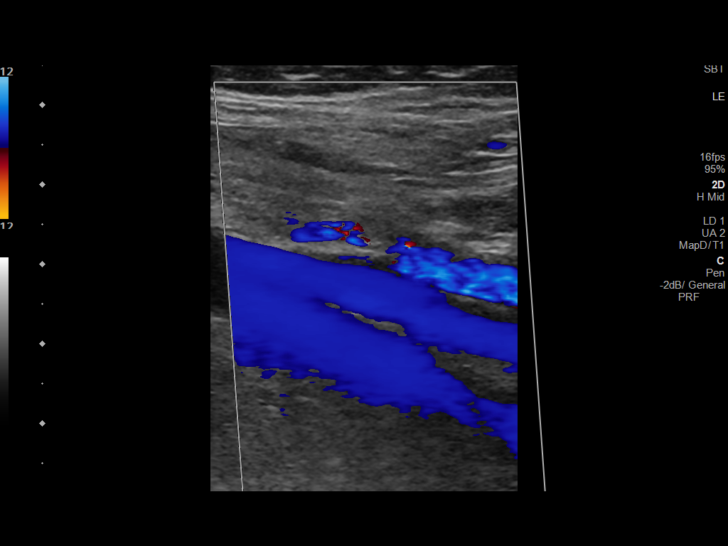
[im 14/41]
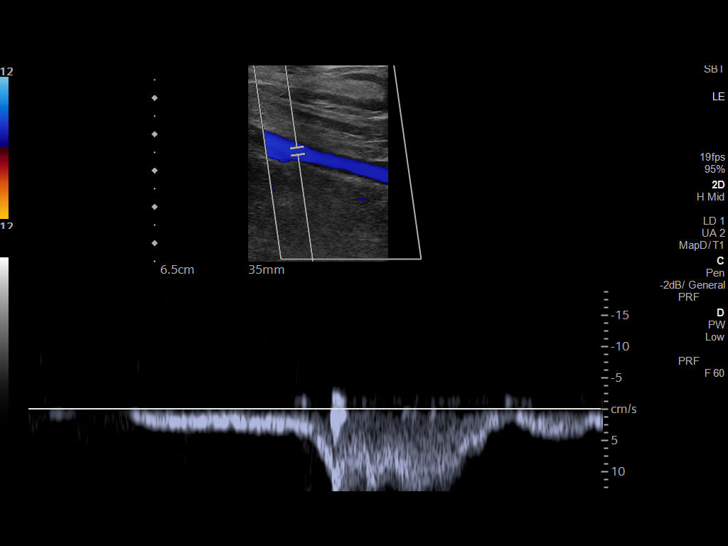
[im 18/41]
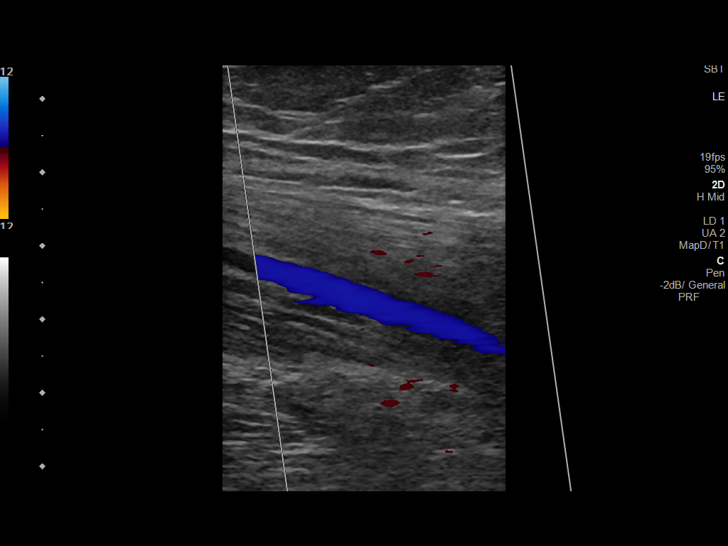
[im 23/41]
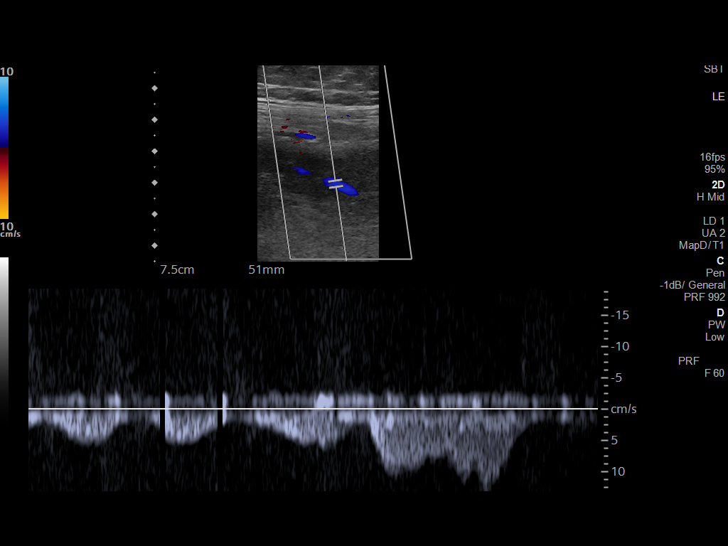
[im 25/41]
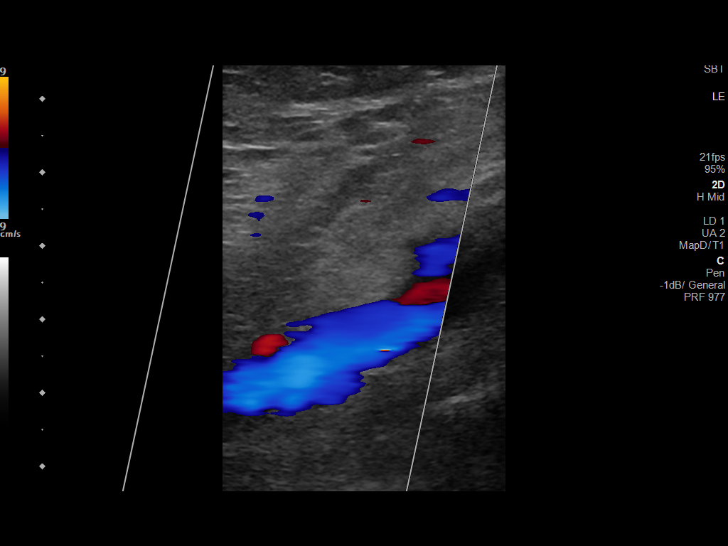
[im 28/41]
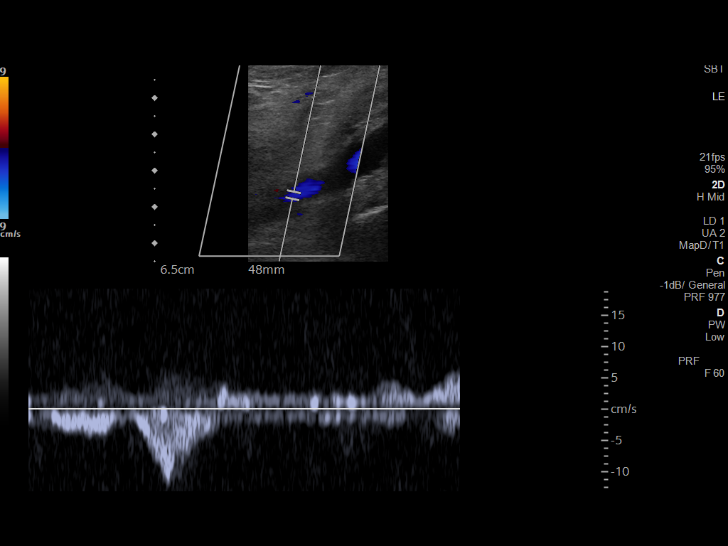
[im 32/41]
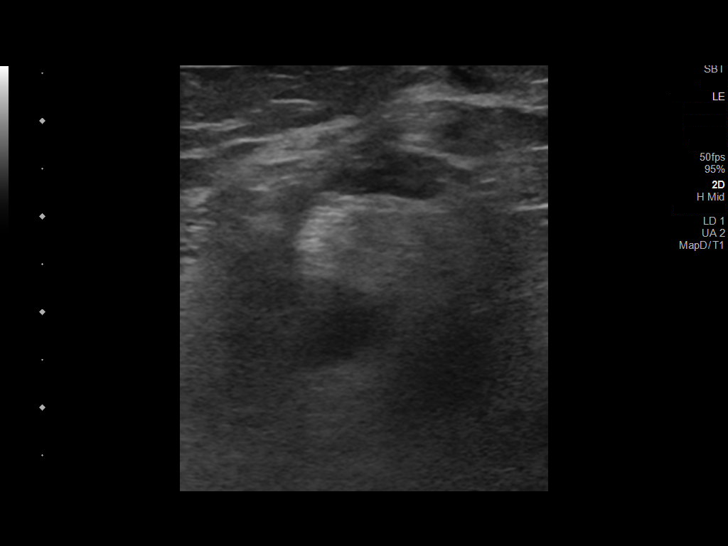
[im 34/41]
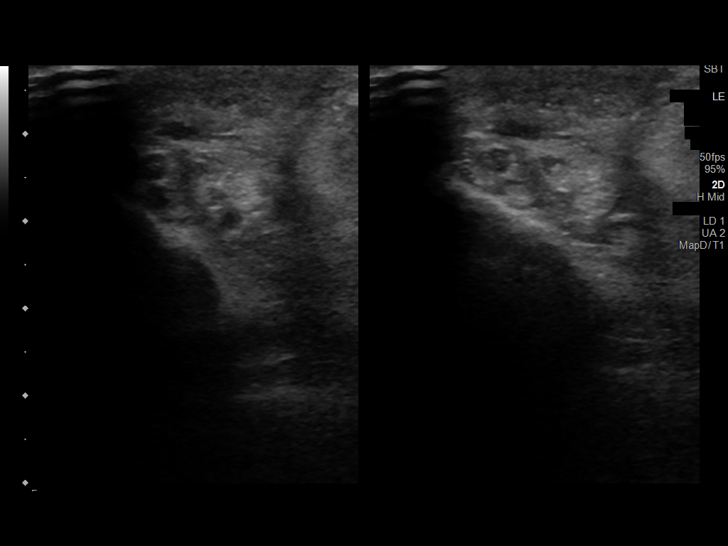
[im 37/41]
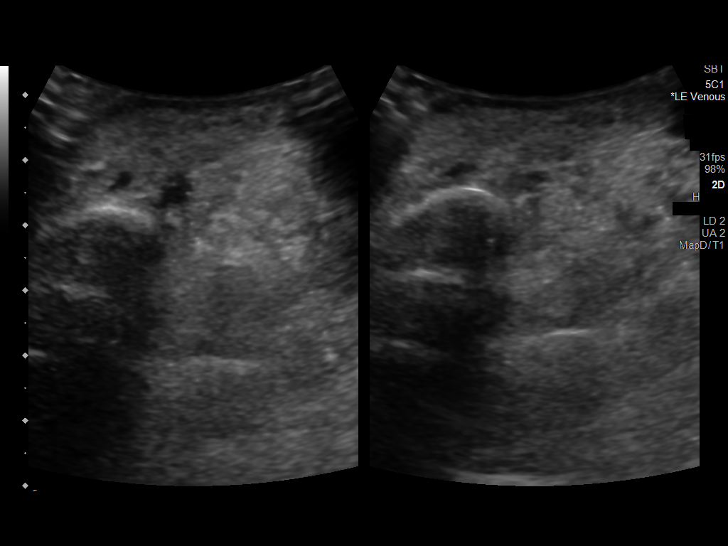
[im 41/41]
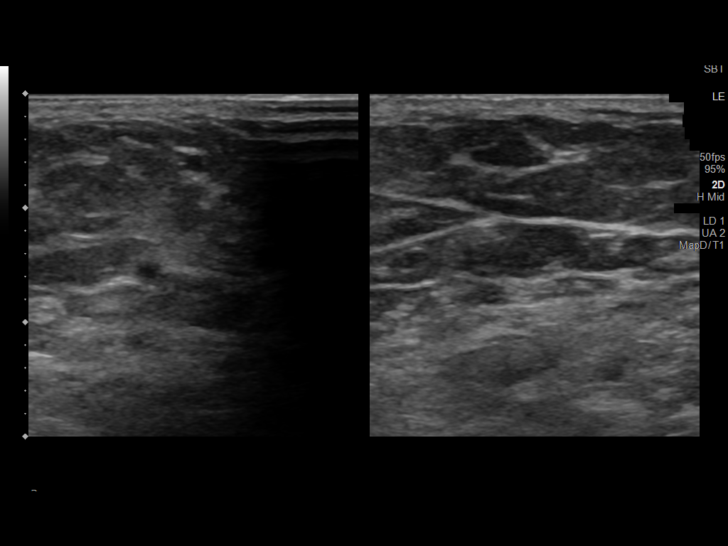

[13 of 24 positions shown; findings below may reference images not displayed]

FINDINGS: Contralateral Common Femoral Vein: Respiratory phasicity is normal
and symmetric with the symptomatic side. No evidence of thrombus.
Normal compressibility.

Common Femoral Vein: No evidence of thrombus. Normal
compressibility, respiratory phasicity and response to augmentation.

Saphenofemoral Junction: No evidence of thrombus. Normal
compressibility and flow on color Doppler imaging.

Profunda Femoral Vein: No evidence of thrombus. Normal
compressibility and flow on color Doppler imaging.

Femoral Vein: No evidence of thrombus. Normal compressibility,
respiratory phasicity and response to augmentation.

Popliteal Vein: No evidence of thrombus. Normal compressibility,
respiratory phasicity and response to augmentation.

Calf Veins: No evidence of thrombus. Normal compressibility and flow
on color Doppler imaging.

Superficial Great Saphenous Vein: No evidence of thrombus. Normal
compressibility.

Other Findings: Anechoic collection at the popliteal fossa is
compatible with a small cyst that measures 2.1 x 0.8 x 1.7 cm.
IMPRESSION: 1.  Negative for deep venous thrombosis in right lower extremity.
2. Small right knee Baker's cyst.

## 2020-05-25 IMAGING — DX DG ANKLE COMPLETE 3+V*R*
3 series · 3 of 3 positions shown · non-contrast
Comparison: None.

CLINICAL DATA: Fall 3 days prior, right ankle pain

EXAM:
RIGHT ANKLE - COMPLETE 3+ VIEW

[ankle ap]
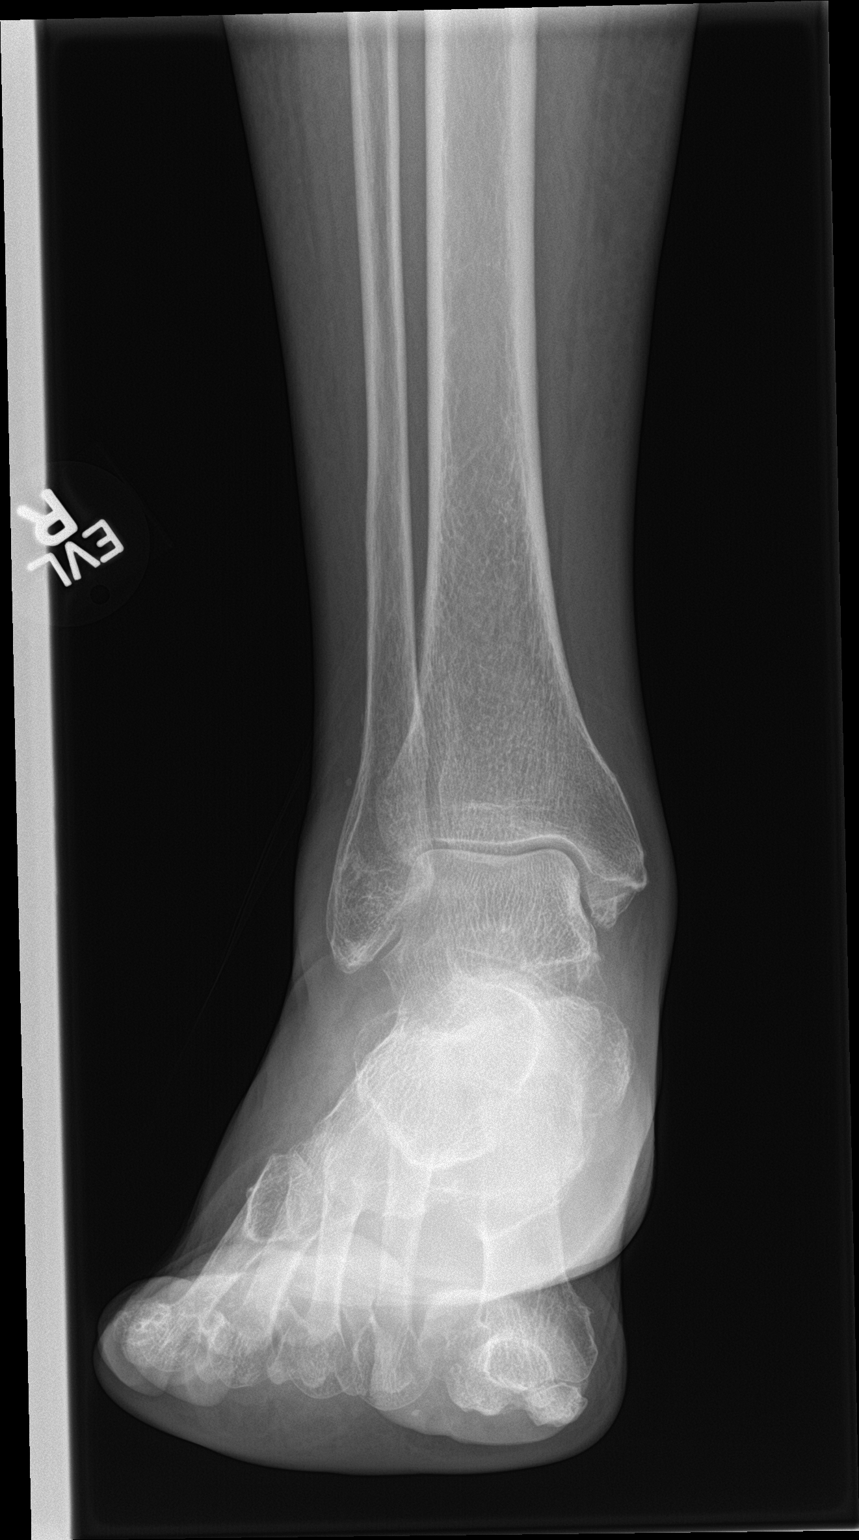

[ankle obl]
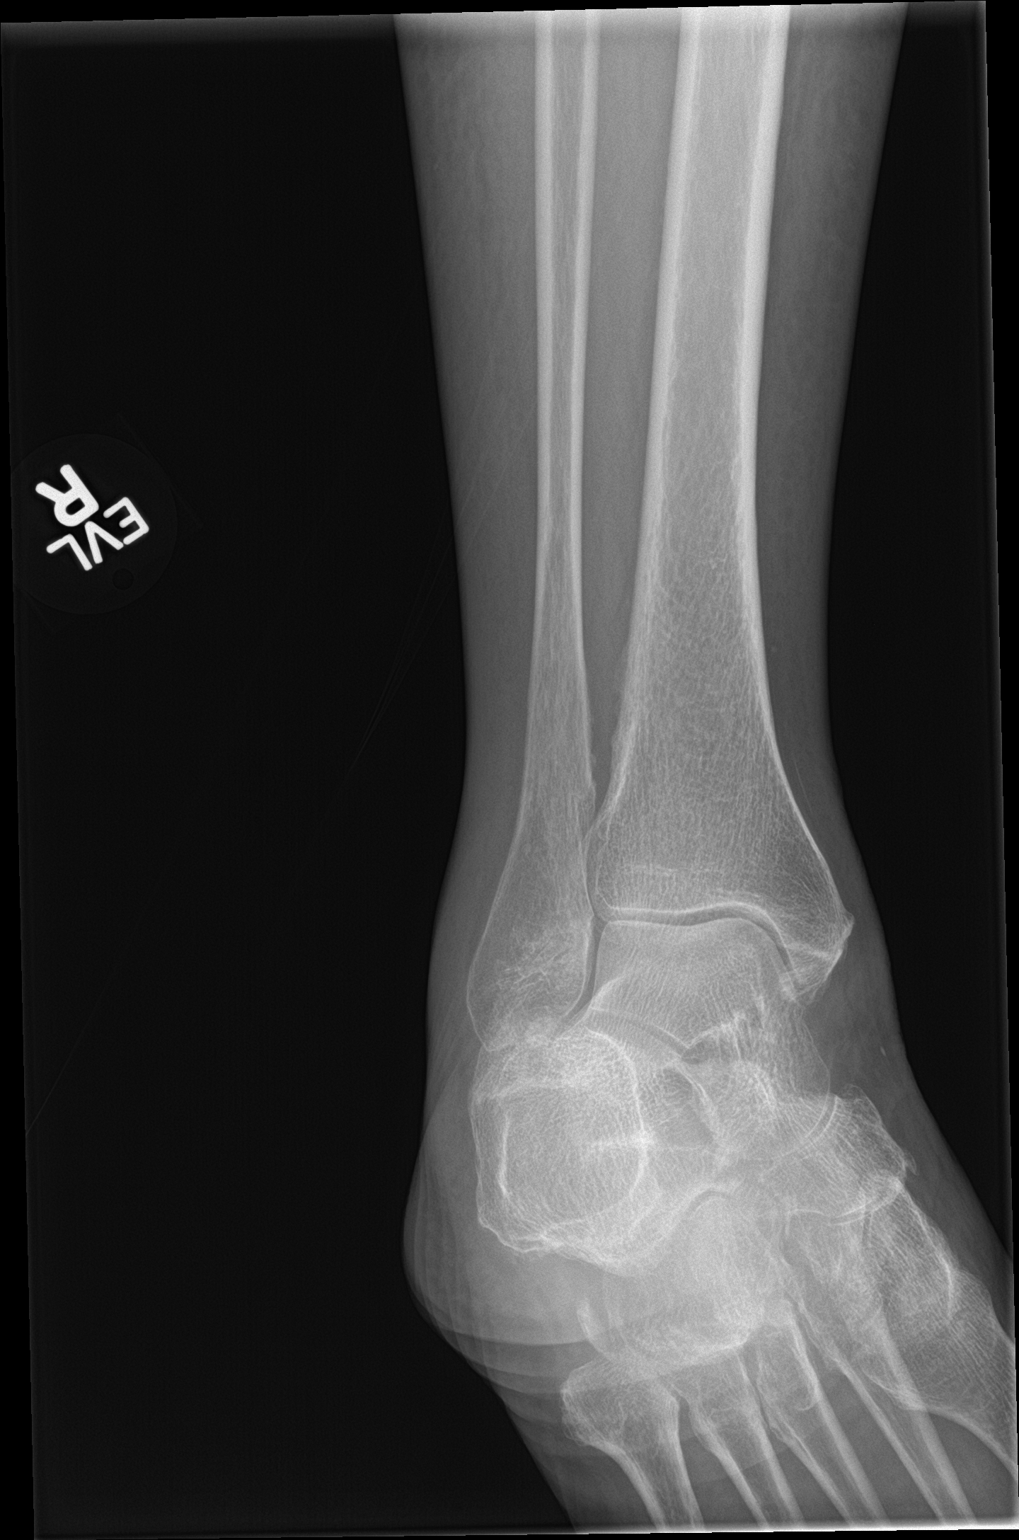

[ankle lat]
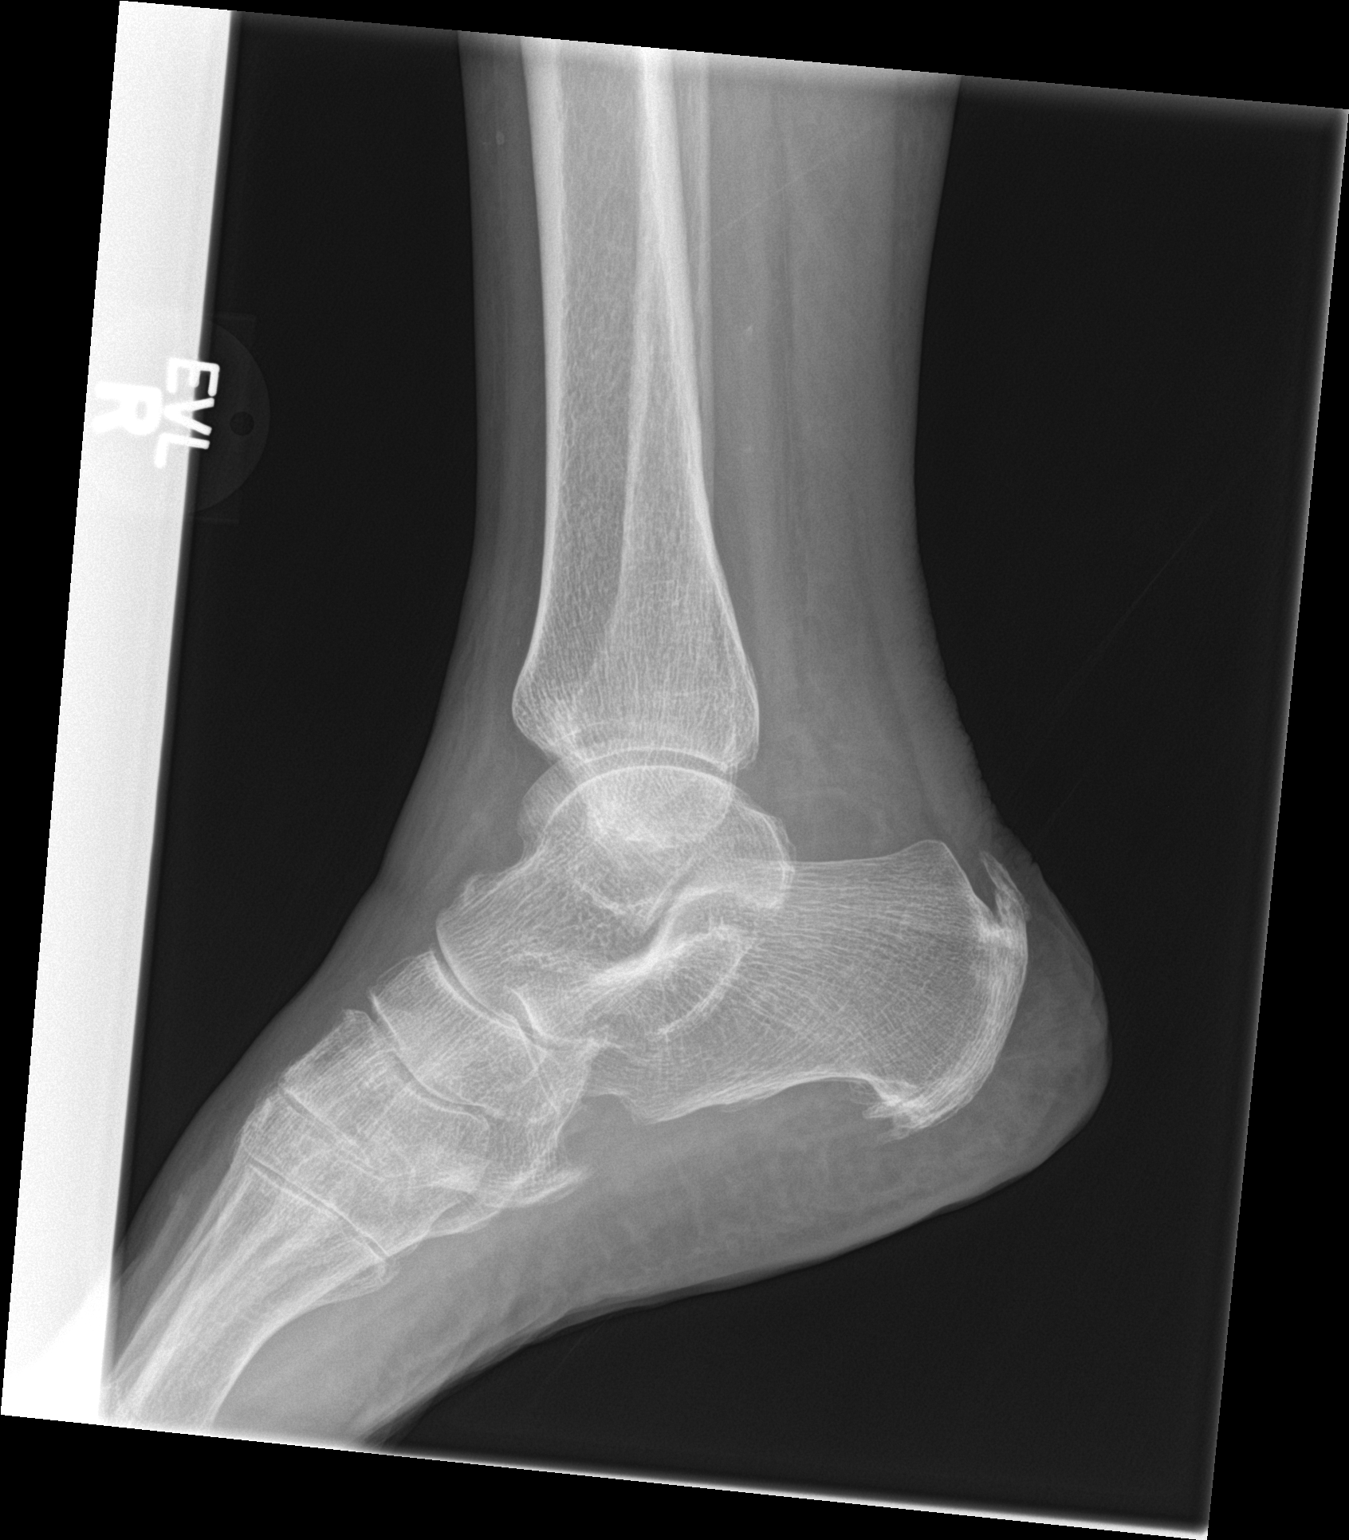

[3 of 3 positions shown; findings below may reference images not displayed]

FINDINGS: No fracture or subluxation. No suspicious focal osseous lesions.
Large Achilles and small plantar right calcaneal spurs. No
radiopaque foreign bodies.
IMPRESSION: No right ankle fracture or subluxation.

Large Achilles and small plantar right calcaneal spurs.

## 2021-05-02 ENCOUNTER — Ambulatory Visit: Payer: Federal, State, Local not specified - PPO | Attending: Internal Medicine

## 2021-05-02 DIAGNOSIS — Z23 Encounter for immunization: Secondary | ICD-10-CM

## 2021-05-02 NOTE — Progress Notes (Signed)
   Covid-19 Vaccination Clinic  Name:  MEYGAN KYSER    MRN: [id number] DOB: 13-Sep-1935  05/02/2021  Ms. Wallner was observed post Covid-19 immunization for 15 minutes without incident. She was provided with Vaccine Information Sheet and instruction to access the V-Safe system.   Ms. Herbold was instructed to call 911 with any severe reactions post vaccine: Difficulty breathing  Swelling of face and throat  A fast heartbeat  A bad rash all over body  Dizziness and weakness   Immunizations Administered     Name Date Dose VIS Date Route   Pfizer Covid-19 Vaccine Bivalent Booster 05/02/2021 10:38 AM 0.3 mL 02/23/2021 Intramuscular   Manufacturer: [employer], [employer]   Lot: [id number]   NDC: [phone]

## 2021-05-17 ENCOUNTER — Other Ambulatory Visit (HOSPITAL_BASED_OUTPATIENT_CLINIC_OR_DEPARTMENT_OTHER): Payer: Self-pay

## 2021-05-17 MED ORDER — PFIZER COVID-19 VAC BIVALENT 30 MCG/0.3ML IM SUSP
INTRAMUSCULAR | 0 refills | Status: DC
  Filled 2021-05-17: qty 0.3, 1d supply, fill #0

## 2022-05-09 ENCOUNTER — Telehealth: Payer: Self-pay | Admitting: Family

## 2022-05-09 ENCOUNTER — Other Ambulatory Visit: Payer: Self-pay

## 2022-05-09 ENCOUNTER — Emergency Department (HOSPITAL_BASED_OUTPATIENT_CLINIC_OR_DEPARTMENT_OTHER): Payer: Federal, State, Local not specified - PPO

## 2022-05-09 ENCOUNTER — Emergency Department (HOSPITAL_BASED_OUTPATIENT_CLINIC_OR_DEPARTMENT_OTHER)
Admission: EM | Admit: 2022-05-09 | Discharge: 2022-05-09 | Disposition: A | Discharge status: Home / Self Care | Payer: Federal, State, Local not specified - PPO | Attending: Emergency Medicine | Admitting: Emergency Medicine

## 2022-05-09 ENCOUNTER — Encounter (HOSPITAL_BASED_OUTPATIENT_CLINIC_OR_DEPARTMENT_OTHER): Payer: Self-pay | Admitting: Emergency Medicine

## 2022-05-09 DIAGNOSIS — Z79899 Other long term (current) drug therapy: Secondary | ICD-10-CM | POA: Diagnosis not present

## 2022-05-09 DIAGNOSIS — I10 Essential (primary) hypertension: Secondary | ICD-10-CM

## 2022-05-09 DIAGNOSIS — R0602 Shortness of breath: Secondary | ICD-10-CM | POA: Diagnosis not present

## 2022-05-09 LAB — COMPREHENSIVE METABOLIC PANEL
ALT: 12 U/L (ref 0–44)
AST: 23 U/L (ref 15–41)
Albumin: 3.7 g/dL (ref 3.5–5.0)
Alkaline Phosphatase: 114 U/L (ref 38–126)
Anion gap: 5 (ref 5–15)
BUN: 18 mg/dL (ref 8–23)
CO2: 29 mmol/L (ref 22–32)
Calcium: 8.8 mg/dL — ABNORMAL LOW (ref 8.9–10.3)
Chloride: 108 mmol/L (ref 98–111)
Creatinine, Ser: 0.94 mg/dL (ref 0.44–1.00)
GFR, Estimated: 60 mL/min — ABNORMAL LOW (ref >60)
Glucose, Bld: 104 mg/dL — ABNORMAL HIGH (ref 70–99)
Potassium: 3.8 mmol/L (ref 3.5–5.1)
Sodium: 142 mmol/L (ref 135–145)
Total Bilirubin: 0.6 mg/dL (ref 0.3–1.2)
Total Protein: 7.8 g/dL (ref 6.5–8.1)

## 2022-05-09 LAB — CBC
HCT: 37.9 % (ref 36.0–46.0)
Hemoglobin: 11.8 g/dL — ABNORMAL LOW (ref 12.0–15.0)
MCH: 27.2 pg (ref 26.0–34.0)
MCHC: 31.1 g/dL (ref 30.0–36.0)
MCV: 87.3 fL (ref 80.0–100.0)
Platelets: 230 10*3/uL (ref 150–400)
RBC: 4.34 MIL/uL (ref 3.87–5.11)
RDW: 14.4 % (ref 11.5–15.5)
WBC: 4.1 10*3/uL (ref 4.0–10.5)
nRBC: 0 % (ref 0.0–0.2)

## 2022-05-09 LAB — TROPONIN I (HIGH SENSITIVITY)
Troponin I (High Sensitivity): 58 ng/L — ABNORMAL HIGH (ref <18)
Troponin I (High Sensitivity): 60 ng/L — ABNORMAL HIGH (ref <18)

## 2022-05-09 LAB — BRAIN NATRIURETIC PEPTIDE: B Natriuretic Peptide: 325.8 pg/mL — ABNORMAL HIGH (ref 0.0–100.0)

## 2022-05-09 MED ORDER — AMLODIPINE BESYLATE 5 MG PO TABS: 5.0000 mg | ORAL_TABLET | Freq: Every day | ORAL | 0 refills | Status: DC

## 2022-05-09 MED ORDER — CLONIDINE HCL 0.1 MG PO TABS
0.1000 mg | ORAL_TABLET | Freq: Once | ORAL | Status: AC
  Administered 2022-05-09: 0.1 mg via ORAL
  Filled 2022-05-09: qty 1

## 2022-05-09 MED ORDER — LOSARTAN POTASSIUM 50 MG PO TABS: 50.0000 mg | ORAL_TABLET | Freq: Every day | ORAL | 0 refills | Status: DC

## 2022-05-09 NOTE — ED Provider Notes (Signed)
[facility] EMERGENCY DEPARTMENT Provider Note   CSN: [id number] Arrival date & time: 05/09/22  1526     History Chief Complaint  Patient presents with   Hypertension    Brandy Stevens is a 86 y.o. female with PMHx significant for hypertension who presents today for hypertension. Denies headache, vision changes, shortness of breath, chest pain, confusion, or seizures. She reports that she was at her dentist who reported an elevated blood pressure reading and stated she needed to be seen by her PCP to manage. Patient tried to be seen by PCP but was unable to and was told to come to [facility] for treatment. Previously took medications for HTN but has not taken any for several years, "before covid". Denies any history of kidney or liver disease.   Hypertension Pertinent negatives include no chest pain and no shortness of breath.       Home Medications Prior to Admission medications   Medication Sig Start Date End Date Taking? Authorizing Provider  amLODipine (NORVASC) 5 MG tablet Take 1 tablet (5 mg total) by mouth daily. 10/17/18   Debbrah Alar, NP  Calcium Carbonate-Vitamin D (CALTRATE 600+D) 600-400 MG-UNIT per tablet Take 1 tablet by mouth 2 (two) times daily.    [provider]  cholecalciferol (VITAMIN D) 1000 UNITS tablet Take 2,000 Units by mouth daily. 01/09/11   Debbrah Alar, NP  COVID-19 mRNA bivalent vaccine, Pfizer, (PFIZER COVID-19 VAC BIVALENT) injection Inject into the muscle. 05/02/21   Carlyle Basques, MD  docusate sodium (COLACE) 100 MG capsule Take 1 capsule by mouth 1 to 2 times daily as needed.    [provider]  ferrous sulfate (FERROUSUL) 325 (65 FE) MG tablet Take 325 mg by mouth daily with breakfast.     [provider]  gabapentin (NEURONTIN) 300 MG capsule Take 1 capsule (300 mg total) by mouth at bedtime. 01/20/19   Debbrah Alar, NP  lisinopril (ZESTRIL) 10 MG tablet Take 10 mg by mouth daily. 06/05/11    Debbrah Alar, NP  losartan-hydrochlorothiazide (HYZAAR) 100-12.5 MG tablet Take 1 tablet by mouth daily. 06/14/18   Debbrah Alar, NP  metoprolol succinate (TOPROL-XL) 50 MG 24 hr tablet Take 1 tablet (50 mg total) by mouth daily. Take with or immediately following a meal. 08/28/18   Debbrah Alar, NP  Omega-3 Fatty Acids (FISH OIL) 1000 MG CAPS Take 1 capsule by mouth daily. Currently takes it once a week in the summer.    [provider]      Allergies    Ace inhibitors    Review of Systems   Review of Systems  Constitutional:  Negative for fatigue and fever.  HENT:  Negative for hearing loss, nosebleeds and tinnitus.   Respiratory:  Negative for cough, chest tightness and shortness of breath.   Cardiovascular:  Negative for chest pain, palpitations and leg swelling.  Genitourinary:  Negative for decreased urine volume and frequency.    Physical Exam Updated Vital Signs BP (!) 231/86 (BP Location: Left Arm)   Pulse 87   Temp 97.8 F (36.6 C)   Resp 20   Ht 5\' 3"  (1.6 m)   Wt 72.6 kg   SpO2 95%   BMI 28.34 kg/m  Physical Exam Constitutional:      General: She is not in acute distress.    Appearance: Normal appearance. She is normal weight.  Cardiovascular:     Rate and Rhythm: Normal rate and regular rhythm.     Pulses: Normal pulses.  Heart sounds: Murmur heard.  Pulmonary:     Effort: Pulmonary effort is normal.     Breath sounds: Normal breath sounds. No wheezing.  Chest:     Chest wall: No tenderness.  Neurological:     Mental Status: She is alert.     ED Results / Procedures / Treatments   Labs (all labs ordered are listed, but only abnormal results are displayed) Labs Reviewed  COMPREHENSIVE METABOLIC PANEL  CBC    EKG None  Radiology No results found.  Procedures Procedures    Medications Ordered in ED Medications - No data to display  ED Course/ Medical Decision Making/ A&P Clinical Course as of 05/09/22 1830   Tue May 09, 2022  1706 GFR, Estimated(!): 60 [OZ]    Clinical Course User Index [OZ] Smitty Knudsen, PA-C                           Medical Decision Making 86 y/o with past history of hypertension presents to the ER with asymptomatic hypertension. Patient had a history of lower extremity edema but noted no recent edema. Patient previously had managed hypertension with PCP but has been off of antihypertensives for approximately 3 years. She attempted to have dental visit today but was advised to see PCP due to elevated BP which persisted when attempting to be seen by PCP. Troponin elevated without prior to compare was likely due to persistently elevated BP which is likely to return to baseline now as BP is normalizing with dose of clonidine.  Amount and/or Complexity of Data Reviewed Labs: ordered. Decision-making details documented in ED Course.    Details: Elevated troponin and BNP noted with no prior to compare. Likely secondary to persistently elevated and unmanaged hypertension. Radiology: ordered and independent interpretation performed.    Details: Chest xray negative for signs of cardiopulmonary disease. ECG/medicine tests: ordered and independent interpretation performed.    Details: ECG reassuring for no ischemic changes.   Handoff discussed with oncoming provider for 2nd troponin. If repeat troponin is normal, patient is safe to discharge to home on likely losartan and follow-up with PCP for hypertension management. Not concerned for hypertensive emergency at this time due to lack of signs of end organ dysfunction and patient remaining asymptomatic throughout course of visit.  Final Clinical Impression(s) / ED Diagnoses Final diagnoses:  None    Rx / DC Orders ED Discharge Orders     None         Smitty Knudsen, PA-C 05/10/22 0700    Vanetta Mulders, MD 05/16/22 1536

## 2022-05-09 NOTE — Discharge Instructions (Addendum)
Please read and follow all provided instructions.  Your diagnoses today include:  1. Primary hypertension    Your blood pressure was high today (BP): BP (!) 188/74   Pulse (!) 54   Temp 97.7 F (36.5 C) (Oral)   Resp 20   Ht 5\' 3"  (1.6 m)   Wt 72.6 kg   SpO2 99%   BMI 28.34 kg/m   Tests performed today include: Vital signs. See below for your results today.  Complete blood cell count: No problems Basic metabolic panel: Kidney function was normal Cardiac enzymes (blood test looking for stress on the heart): Are elevated but stable Chest x-ray: Clear without signs of heart failure  Medications prescribed:  Amlodipine: Medication for blood pressure  Losartan: Medication for blood pressure  Home care instructions:  Follow any educational materials contained in this packet.  Follow-up instructions: Please follow-up with your primary care provider in the next 7 days for a recheck of your symptoms and blood pressure.    Return instructions:  Please return to the Emergency Department if you experience worsening symptoms.  Return with severe chest pain, abdominal pain, or shortness of breath.  Return with severe headache, focal weakness, numbness, difficulty with speech or vision. Return with loss of vision or sudden vision change. Please return if you have any other emergent concerns.  Additional Information:  Your vital signs today were: BP (!) 188/74   Pulse (!) 54   Temp 97.7 F (36.5 C) (Oral)   Resp 20   Ht 5\' 3"  (1.6 m)   Wt 72.6 kg   SpO2 99%   BMI 28.34 kg/m  If your blood pressure (BP) was elevated above 135/85 this visit, please have this repeated by your doctor within one month. --------------

## 2022-05-09 NOTE — ED Provider Notes (Signed)
Signout from [employer] at shift change. Briefly, patient presents for elevated blood pressure readings after being noted at her dentist office today.  She denies symptoms.  She has been off of her blood pressure medications for about 3 years.   Plan: Work-up reassuring to this point however troponin was elevated, pending second troponin.   8:15 PM Reassessment performed. Patient appears stable.  Blood pressures have been running in the 180s systolic.  Labs and imaging personally reviewed and interpreted including: CBC hemoglobin 11.8 otherwise unremarkable; CMP normal kidney function; BNP minimally elevated; troponin 58 > 60.  Chest x-ray, agree negative.   Most current vital signs reviewed and are as follows: BP (!) 188/74   Pulse (!) 54   Temp 97.7 F (36.5 C) (Oral)   Resp 20   Ht 5\' 3"  (1.6 m)   Wt 72.6 kg   SpO2 99%   BMI 28.34 kg/m   Plan: Discharge to home with close PCP follow-up.  Will restart patient on amlodipine 5 mg and losartan 50 mg.   Return and follow-up instructions: Encouraged return to ED with chest pain, shortness of breath, severe headache or other concerns. Encouraged patient to follow-up with their provider in 7 days. Patient verbalized understanding and agreed with plan.        , PA-C 05/09/22 2017    2018, MD 05/10/22 0020

## 2022-05-09 NOTE — Telephone Encounter (Signed)
Patient came in this afternoon at 3 asking to make an appt with MO. I advised her that she has not been seen by MO in over 3 years BUT has been seen in office with Dr Drue Novel, so it was a close call for her to come in a still be a patient with [place] & I would love to get her scheduled She came in because she was at the dentist for a filling and was sent away because her BP was 209/90(that's what the patient thinks it was) I let patient know I could go ahead and get her scheduled for CPE so we could still see her as a patient while we waited on a nurse to respond.   As I was trying to get the patient schedule another patient behind her stood up & began to tell me, again, that patient could fall out at anytime & I should get a nurse ASAP.   Patient herself really just wanted to hear verbally what she should do...   I went to go get the nurse & let her know what was going on.   Melissa's CMA advised patient to go to the ED.

## 2022-05-09 NOTE — ED Triage Notes (Addendum)
Pt states she went to the dentist this morning and was told her BP was high. Pt proceeded to go to her PCP but was unable to be seen and told pt to come here. Reports being on BP meds in the past, but none in a couple years. Pt denies sx. States she feels fine. Recent stressor with family sickness.

## 2022-05-10 NOTE — Telephone Encounter (Signed)
Patient was advised, per Sandford Craze FNP, to seen in person evaluation for immediate BP control and to follow up with [place] later on for a physical.

## 2022-05-29 HISTORY — PX: MOUTH SURGERY: SHX715

## 2022-05-30 ENCOUNTER — Encounter: Payer: Self-pay | Admitting: Family

## 2022-05-30 ENCOUNTER — Ambulatory Visit: Payer: Federal, State, Local not specified - PPO | Admitting: Family

## 2022-05-30 ENCOUNTER — Other Ambulatory Visit (HOSPITAL_BASED_OUTPATIENT_CLINIC_OR_DEPARTMENT_OTHER): Payer: Self-pay

## 2022-05-30 VITALS — BP 190/80 | HR 83 | Temp 98.4°F | Resp 16 | Ht 63.0 in | Wt 156.0 lb

## 2022-05-30 DIAGNOSIS — E785 Hyperlipidemia, unspecified: Secondary | ICD-10-CM

## 2022-05-30 DIAGNOSIS — D509 Iron deficiency anemia, unspecified: Secondary | ICD-10-CM

## 2022-05-30 DIAGNOSIS — M8580 Other specified disorders of bone density and structure, unspecified site: Secondary | ICD-10-CM | POA: Diagnosis not present

## 2022-05-30 DIAGNOSIS — I10 Essential (primary) hypertension: Secondary | ICD-10-CM | POA: Diagnosis not present

## 2022-05-30 DIAGNOSIS — E559 Vitamin D deficiency, unspecified: Secondary | ICD-10-CM | POA: Diagnosis not present

## 2022-05-30 MED ORDER — COMIRNATY 30 MCG/0.3ML IM SUSY
PREFILLED_SYRINGE | INTRAMUSCULAR | 0 refills | Status: AC
  Filled 2022-05-30: qty 0.3, 1d supply, fill #0

## 2022-05-30 MED ORDER — LOSARTAN POTASSIUM 100 MG PO TABS: 100.0000 mg | ORAL_TABLET | Freq: Every day | ORAL | 1 refills | Status: DC

## 2022-05-30 NOTE — Assessment & Plan Note (Addendum)
Not currently on calcium. Recommended that she restart calcium and update bone density.

## 2022-05-30 NOTE — Assessment & Plan Note (Signed)
Lab Results  Component Value Date   CHOL 191 07/15/2014   HDL 61.90 07/15/2014   LDLCALC 115 (H) 07/15/2014   TRIG 72.0 07/15/2014   CHOLHDL 3 07/15/2014   Plan lipid panel next visit.

## 2022-05-30 NOTE — Progress Notes (Signed)
Subjective:   By signing my name below, I, Brandy Stevens, attest that this documentation has been prepared under the direction and in the presence of Brandy Chimera, NP 05/30/2022   Patient ID: Brandy Stevens, female    DOB: Aug 26, 1935, 86 y.o.   MRN: [id number]  Chief Complaint  Patient presents with   Follow-up    Follow up after er visit for elevated blood pressure    HPI Patient is in today for an office visit  ED Visit (Primary Hypertension): She was admitted to the ED on 05/09/2022 for primary hypertension. She was admitted to the ED due to a 102+ systolic reading while at the dentist office. She denied of any symptoms at the time. She was discharged on the same day. She was prescribed 5 mg of Amlodipine and 50 mg of Losartan. She recorded her blood pressure the morning of this office and reports a reading of 131/96 mmHg BP Readings from Last 3 Encounters:  05/30/22 (!) 190/80  05/09/22 (!) 176/71  06/18/19 (!) 221/87   Pulse Readings from Last 3 Encounters:  05/30/22 83  05/09/22 (!) 54  06/18/19 82   Vitamin D: The last reported time that her Vitamin D was low was in 2012.   Gabapentin: She is no longer taking 300 mg of Gabapentin  Colace: She takes her 100 mg of Colace PRN  Iron: She takes her 325 (65 Fe) mg supplement occasionally. She has black stools when she is taking her iron supplements.   Fish Oil: She is inquiring if it's okay for her to take a multivitamin Centrum that contains Omega 3.  Mammogram: Last completed on 01/19/2015. She does self examinations at home.   Immunizations: She is not UTD on the Influenza or Covid vaccine. She is interested in receiving the Influenza vaccine during today's visit.  Calcium: She has not taken her calcium supplement in about two weeks.   Exercise: She is regularly walking and enjoys doing Tai Chi.   Health Maintenance Due  Topic Date Due   Zoster Vaccines- Shingrix (1 of 2) Never done   Pneumonia Vaccine 2+  Years old (1 - PCV) Never done   DTaP/Tdap/Td (2 - Tdap) 08/26/2019   INFLUENZA VACCINE  01/24/2022   COVID-19 Vaccine (5 - 2023-24 season) 02/24/2022    Past Medical History:  Diagnosis Date   Allergy    allergic rhinitis   Anemia    nos   History of chicken pox    Hypertension    Systolic murmur    Normal valves on 2-d echo 5/11    Past Surgical History:  Procedure Laterality Date   MOUTH SURGERY  05/29/2022   RIGHT OOPHORECTOMY  06/26/1966    Family History  Problem Relation Age of Onset   Other Mother        complications from hip fx   Pneumonia Father    Osteoarthritis Brother    Actinic keratosis Brother     Social History   Socioeconomic History   Marital status: Widowed    Spouse name: Not on file   Number of children: 1   Years of education: Not on file   Highest education level: Not on file  Occupational History   Occupation: Optician, dispensing: [employer]  Tobacco Use   Smoking status: Never   Smokeless tobacco: Never  Substance and Sexual Activity   Alcohol use: No   Drug use: No   Sexual activity: Not on file  Other Topics Concern   Not on file  Social History Narrative   7 brothers-- 1 died from unknown cause, 1 died from pneumonia. 5 still living.   1 son living- lives locally, he has a son who lives locally-    One great granddaughter   Widowed, husband died in August 21, 2005   Has two outdoor   Enjoys volunteering, Photographer for shopping trips at [employer]   Financial Resource Strain: Not on file  Food Insecurity: Not on file  Transportation Needs: Not on file  Physical Activity: Not on file  Stress: Not on file  Social Connections: Not on file  Intimate Partner Violence: Not on file    Outpatient Medications Prior to Visit  Medication Sig Dispense Refill   amLODipine (NORVASC) 5 MG tablet Take 1 tablet (5 mg total) by mouth daily. 30 tablet 0   Calcium Carbonate-Vitamin D  (CALTRATE 600+D) 600-400 MG-UNIT per tablet Take 1 tablet by mouth 2 (two) times daily.     cholecalciferol (VITAMIN D) 1000 UNITS tablet Take 2,000 Units by mouth daily.     docusate sodium (COLACE) 100 MG capsule Take 1 capsule by mouth 1 to 2 times daily as needed.     ferrous sulfate (FERROUSUL) 325 (65 FE) MG tablet Take 325 mg by mouth daily with breakfast.      COVID-19 mRNA bivalent vaccine, Pfizer, (PFIZER COVID-19 VAC BIVALENT) injection Inject into the muscle. 0.3 mL 0   gabapentin (NEURONTIN) 300 MG capsule Take 1 capsule (300 mg total) by mouth at bedtime. 30 capsule 3   losartan (COZAAR) 50 MG tablet Take 1 tablet (50 mg total) by mouth daily. 30 tablet 0   Omega-3 Fatty Acids (FISH OIL) 1000 MG CAPS Take 1 capsule by mouth daily. Currently takes it once a week in the summer.     No facility-administered medications prior to visit.    Allergies  Allergen Reactions   Ace Inhibitors Other (See Comments)    cough    ROS    See HPI Objective:    Physical Exam Constitutional:      General: She is not in acute distress.    Appearance: Normal appearance. She is not ill-appearing.  HENT:     Head: Normocephalic and atraumatic.     Right Ear: External ear normal.     Left Ear: External ear normal.  Eyes:     Extraocular Movements: Extraocular movements intact.     Pupils: Pupils are equal, round, and reactive to light.  Cardiovascular:     Rate and Rhythm: Normal rate and regular rhythm.     Heart sounds: Normal heart sounds. No murmur heard.    No gallop.  Pulmonary:     Effort: Pulmonary effort is normal. No respiratory distress.     Breath sounds: Normal breath sounds. No wheezing or rales.  Musculoskeletal:     Right lower leg: 1+ Edema present.     Left lower leg: 1+ Edema present.  Skin:    General: Skin is warm and dry.  Neurological:     Mental Status: She is alert and oriented to person, place, and time.  Psychiatric:        Mood and Affect: Mood normal.         Behavior: Behavior normal.        Judgment: Judgment normal.     BP (!) 190/80 (BP Location: Left Arm, Patient Position: Sitting, Cuff Size: Small)  Pulse 83   Temp 98.4 F (36.9 C) (Oral)   Resp 16   Ht _0  (1.6 m)   Wt 156 lb (70.8 kg)   SpO2 100%   BMI 27.63 kg/m  Wt Readings from Last 3 Encounters:  05/30/22 156 lb (70.8 kg)  05/09/22 160 lb (72.6 kg)  06/18/19 162 lb 2 oz (73.5 kg)       Assessment & Plan:   Problem List Items Addressed This Visit       Unprioritized   Vitamin D deficiency    Plan to check Vit D next visit when we do labs.       Osteopenia    Not currently on calcium. Recommended that she restart calcium and update bone density.       Relevant Orders   DG Bone Density   Hyperlipemia    Lab Results  Component Value Date   CHOL 191 07/15/2014   HDL 61.90 07/15/2014   LDLCALC 115 (H) 07/15/2014   TRIG 72.0 07/15/2014   CHOLHDL 3 07/15/2014  Plan lipid panel next visit.       Relevant Medications   losartan (COZAAR) 100 MG tablet   Essential hypertension - Primary    BP Readings from Last 3 Encounters:  05/30/22 (!) 193/76  05/09/22 (!) 176/71  06/18/19 (!) 221/87  131/62 this AM.  She is currently taking amlodipine 26m and losartan 553m  I definitely think she has a strong white coat hypertension. Advised her to add a second dose of losartan now and send me her updated blood pressure reading tomorrow AM.  Will bring her back in 1 week for follow up bp check and bmet.       Relevant Medications   losartan (COZAAR) 100 MG tablet   ANEMIA-NOS   Meds ordered this encounter  Medications   losartan (COZAAR) 100 MG tablet    Sig: Take 1 tablet (100 mg total) by mouth daily.    Dispense:  90 tablet    Refill:  1    Order Specific Question:   Supervising Provider    Answer:   BLPenni Homans [[id number]]    I, Brandy Stevens, personally preformed the services described in this documentation.  All medical record entries  made by the scribe were at my direction and in my presence.  I have reviewed the chart and discharge instructions (if applicable) and agree that the record reflects my personal performance and is accurate and complete. 05/30/2022   I,Brandy Stevens,acting as a scribe for Brandy Stevens.,have documented all relevant documentation on the behalf of Brandy Stevens,as directed by  Brandy Stevens while in the presence of Brandy Stevens.    Brandy Stevens

## 2022-05-30 NOTE — Assessment & Plan Note (Signed)
Plan to check Vit D next visit when we do labs.

## 2022-05-30 NOTE — Assessment & Plan Note (Addendum)
BP Readings from Last 3 Encounters:  05/30/22 (!) 193/76  05/09/22 (!) 176/71  06/18/19 (!) 221/87   131/62 this AM.  She is currently taking amlodipine 5mg  and losartan 50mg .  I definitely think she has a strong white coat hypertension. Advised her to add a second dose of losartan now and send me her updated blood pressure reading tomorrow AM.  Will bring her back in 1 week for follow up bp check and bmet.

## 2022-05-31 ENCOUNTER — Telehealth: Payer: Self-pay | Admitting: Family

## 2022-05-31 NOTE — Telephone Encounter (Signed)
Pt called stating that she wanted to let Melissa know of the following BP reading:  143/69 Pul 84 - Taken 12.6.23 @ 8:30a - Meds not taken till 9a

## 2022-06-05 ENCOUNTER — Ambulatory Visit: Payer: Federal, State, Local not specified - PPO | Admitting: Family

## 2022-06-05 ENCOUNTER — Encounter: Payer: Self-pay | Admitting: Family

## 2022-06-05 VITALS — BP 134/84 | HR 72 | Temp 97.9°F | Resp 18 | Ht 63.0 in | Wt 161.0 lb

## 2022-06-05 DIAGNOSIS — I10 Essential (primary) hypertension: Secondary | ICD-10-CM

## 2022-06-05 LAB — BASIC METABOLIC PANEL
BUN: 20 mg/dL (ref 6–23)
CO2: 31 mEq/L (ref 19–32)
Calcium: 9 mg/dL (ref 8.4–10.5)
Chloride: 106 mEq/L (ref 96–112)
Creatinine, Ser: 0.98 mg/dL (ref 0.40–1.20)
GFR: 52.87 mL/min — ABNORMAL LOW (ref >60.00)
Glucose, Bld: 108 mg/dL — ABNORMAL HIGH (ref 70–99)
Potassium: 4.2 mEq/L (ref 3.5–5.1)
Sodium: 143 mEq/L (ref 135–145)

## 2022-06-05 NOTE — Assessment & Plan Note (Addendum)
Significant white coat hypertension. Pt brings with her today her home bp meter.  Similar reading to our reading here so I believe it to be accurate.  Her home readings are in range even though bp is very high here in the office.  Will continue current regimen.   Addendum: Pt called in her home bp reading 12/11- 134/84.  Much better than the 2 readings we had in the office. No adjustments planned at this time.    191/68 182/96 134/84

## 2022-06-05 NOTE — Progress Notes (Signed)
Subjective:   By signing my name below, I, Shehryar Baig, attest that this documentation has been prepared under the direction and in the presence of Sandford Craze, NP. 06/05/2022   Patient ID: Brandy Stevens, female    DOB: [birth year], 86 y.o.   MRN: [id number]  Chief Complaint  Patient presents with   Follow-up    HPI Patient is in today for a follow up visit.   Blood pressure: Her blood pressure is elevated during this visit. She continues measuring her blood pressure at home regularly and reports her readings are typically much lower than today's reading in office. She did not measure her blood pressure this morning due to her visit today. Her blood pressure measured 138/74 yesterday. Her blood pressure measured 182/96 in office using her blood pressure cuff from home. Her losartan was increased from 50 mg to 100 mg during last visit. She reports no new issues while taking. She continues taking 5 mg amlodipine daily PO and reports no new issues while taking it.  BP Readings from Last 3 Encounters:  06/07/22 134/84  05/30/22 (!) 190/80  05/09/22 (!) 176/71   Pulse Readings from Last 3 Encounters:  06/05/22 72  05/30/22 83  05/09/22 (!) 54   Immunizations: She received the new Covid-19 recently.    Past Medical History:  Diagnosis Date   Allergy    allergic rhinitis   Anemia    nos   History of chicken pox    Hypertension    Systolic murmur    Normal valves on 2-d echo 5/11    Past Surgical History:  Procedure Laterality Date   MOUTH SURGERY  05/29/2022   RIGHT OOPHORECTOMY  06/26/1966    Family History  Problem Relation Age of Onset   Other Mother        complications from hip fx   Pneumonia Father    Osteoarthritis Brother    Actinic keratosis Brother     Social History   Socioeconomic History   Marital status: Widowed    Spouse name: Not on file   Number of children: 1   Years of education: Not on file   Highest education level: Not on file   Occupational History   Occupation: Academic librarian: [employer]  Tobacco Use   Smoking status: Never   Smokeless tobacco: Never  Substance and Sexual Activity   Alcohol use: No   Drug use: No   Sexual activity: Not on file  Other Topics Concern   Not on file  Social History Narrative   7 brothers-- 1 died from unknown cause, 1 died from pneumonia. 5 still living.   1 son living- lives locally, he has a son who lives locally-    One great granddaughter   Widowed, husband died in 2005/10/13   Has two outdoor   Enjoys volunteering, Market researcher for shopping trips at [employer]   Financial Resource Strain: Not on file  Food Insecurity: Not on file  Transportation Needs: Not on file  Physical Activity: Not on file  Stress: Not on file  Social Connections: Not on file  Intimate Partner Violence: Not on file    Outpatient Medications Prior to Visit  Medication Sig Dispense Refill   amLODipine (NORVASC) 5 MG tablet Take 1 tablet (5 mg total) by mouth daily. 30 tablet 0   Calcium Carbonate-Vitamin D (CALTRATE 600+D) 600-400 MG-UNIT per tablet Take 1 tablet by  mouth 2 (two) times daily.     cholecalciferol (VITAMIN D) 1000 UNITS tablet Take 2,000 Units by mouth daily.     COVID-19 mRNA vaccine 2023-2024 (COMIRNATY) syringe Inject into the muscle. 0.3 mL 0   docusate sodium (COLACE) 100 MG capsule Take 1 capsule by mouth 1 to 2 times daily as needed.     ferrous sulfate (FERROUSUL) 325 (65 FE) MG tablet Take 325 mg by mouth daily with breakfast.      losartan (COZAAR) 100 MG tablet Take 1 tablet (100 mg total) by mouth daily. 90 tablet 1   No facility-administered medications prior to visit.    Allergies  Allergen Reactions   Ace Inhibitors Other (See Comments)    cough    ROS See HPI    Objective:    Physical Exam Constitutional:      General: She is not in acute distress.    Appearance: Normal appearance. She  is not ill-appearing.  HENT:     Head: Normocephalic and atraumatic.     Right Ear: External ear normal.     Left Ear: External ear normal.  Eyes:     Extraocular Movements: Extraocular movements intact.     Pupils: Pupils are equal, round, and reactive to light.  Cardiovascular:     Rate and Rhythm: Normal rate and regular rhythm.     Heart sounds: Murmur heard.     Systolic murmur is present with a grade of 2/6.     No gallop.  Pulmonary:     Effort: Pulmonary effort is normal. No respiratory distress.     Breath sounds: Normal breath sounds. No wheezing or rales.  Lymphadenopathy:     Cervical: No cervical adenopathy.  Skin:    General: Skin is warm and dry.  Neurological:     Mental Status: She is alert and oriented to person, place, and time.  Psychiatric:        Judgment: Judgment normal.     BP 134/84   Pulse 72   Temp 97.9 F (36.6 C) (Oral)   Resp 18   Ht 5\' 3"  (1.6 m)   Wt 161 lb (73 kg)   SpO2 100%   BMI 28.52 kg/m  Wt Readings from Last 3 Encounters:  06/05/22 161 lb (73 kg)  05/30/22 156 lb (70.8 kg)  05/09/22 160 lb (72.6 kg)       Assessment & Plan:  Essential hypertension Assessment & Plan: Significant white coat hypertension. Pt brings with her today her home bp meter.  Similar reading to our reading here so I believe it to be accurate.  Her home readings are in range even though bp is very high here in the office.  Will continue current regimen.   Addendum: Pt called in her home bp reading 12/11- 134/84.  Much better than the 2 readings we had in the office. No adjustments planned at this time.    191/68 182/96 134/84    Orders: -     Basic metabolic panel    I, 14/11, NP, personally preformed the services described in this documentation.  All medical record entries made by the scribe were at my direction and in my presence.  I have reviewed the chart and discharge instructions (if applicable) and agree that the record  reflects my personal performance and is accurate and complete. 06/05/2022   I,Shehryar Baig,acting as a scribe for 14/04/2022, NP.,have documented all relevant documentation on the behalf of Lemont Fillers  Peggyann Juba, NP,as directed by  Lemont Fillers, NP while in the presence of Lemont Fillers, NP.   Lemont Fillers, NP

## 2022-06-06 ENCOUNTER — Telehealth: Payer: Self-pay | Admitting: Family

## 2022-06-06 NOTE — Telephone Encounter (Signed)
MO asked for patient to keep up with BP after appt  130pm 12/11- 134/84, 80 pulse 530pm 12/11- 141/73, 73 pulse 900pm 12/11- 146/88, 82 pulse 900am 12/12- 141/73, 73 pulse

## 2022-06-07 NOTE — Telephone Encounter (Signed)
Noted. Better than office readings.

## 2022-06-12 ENCOUNTER — Other Ambulatory Visit: Payer: Self-pay

## 2022-06-12 ENCOUNTER — Telehealth: Payer: Self-pay | Admitting: Family

## 2022-06-12 MED ORDER — AMLODIPINE BESYLATE 5 MG PO TABS: 5.0000 mg | ORAL_TABLET | Freq: Every day | ORAL | 1 refills | Status: DC

## 2022-06-12 NOTE — Telephone Encounter (Signed)
Medication: amLODipine (NORVASC) 5 MG tablet   Has the patient contacted their pharmacy? No.   Preferred Pharmacy (with phone number or street name): [id number] - [place], [place] - [address] AT [facility] [address]  [address], [place] [address]   Agent: Please be advised that RX refills may take up to 3 business days. We ask that you follow-up with your pharmacy.

## 2022-06-29 ENCOUNTER — Ambulatory Visit (HOSPITAL_BASED_OUTPATIENT_CLINIC_OR_DEPARTMENT_OTHER)
Admission: RE | Admit: 2022-06-29 | Discharge: 2022-06-29 | Disposition: A | Discharge status: Home / Self Care | Payer: Federal, State, Local not specified - PPO | Source: Ambulatory Visit | Attending: Family | Admitting: Family

## 2022-06-29 DIAGNOSIS — M8580 Other specified disorders of bone density and structure, unspecified site: Secondary | ICD-10-CM | POA: Diagnosis not present

## 2022-07-01 ENCOUNTER — Encounter: Payer: Self-pay | Admitting: Family

## 2022-09-04 ENCOUNTER — Ambulatory Visit: Payer: Federal, State, Local not specified - PPO | Admitting: Family

## 2022-09-18 ENCOUNTER — Ambulatory Visit: Payer: Federal, State, Local not specified - PPO | Admitting: Family

## 2022-09-18 VITALS — BP 135/60 | HR 91 | Temp 98.6°F | Resp 16 | Wt 165.0 lb

## 2022-09-18 DIAGNOSIS — E785 Hyperlipidemia, unspecified: Secondary | ICD-10-CM

## 2022-09-18 DIAGNOSIS — I10 Essential (primary) hypertension: Secondary | ICD-10-CM

## 2022-09-18 DIAGNOSIS — E559 Vitamin D deficiency, unspecified: Secondary | ICD-10-CM | POA: Diagnosis not present

## 2022-09-18 DIAGNOSIS — D509 Iron deficiency anemia, unspecified: Secondary | ICD-10-CM

## 2022-09-18 DIAGNOSIS — M8580 Other specified disorders of bone density and structure, unspecified site: Secondary | ICD-10-CM | POA: Diagnosis not present

## 2022-09-18 LAB — CBC WITH DIFFERENTIAL/PLATELET
Basophils Absolute: 0 10*3/uL (ref 0.0–0.1)
Basophils Relative: 0.6 % (ref 0.0–3.0)
Eosinophils Absolute: 0.2 10*3/uL (ref 0.0–0.7)
Eosinophils Relative: 3.6 % (ref 0.0–5.0)
HCT: 35.3 % — ABNORMAL LOW (ref 36.0–46.0)
Hemoglobin: 11.5 g/dL — ABNORMAL LOW (ref 12.0–15.0)
Lymphocytes Relative: 37.5 % (ref 12.0–46.0)
Lymphs Abs: 1.8 10*3/uL (ref 0.7–4.0)
MCHC: 32.6 g/dL (ref 30.0–36.0)
MCV: 85.5 fl (ref 78.0–100.0)
Monocytes Absolute: 0.4 10*3/uL (ref 0.1–1.0)
Monocytes Relative: 8.9 % (ref 3.0–12.0)
Neutro Abs: 2.4 10*3/uL (ref 1.4–7.7)
Neutrophils Relative %: 49.4 % (ref 43.0–77.0)
Platelets: 223 10*3/uL (ref 150.0–400.0)
RBC: 4.13 Mil/uL (ref 3.87–5.11)
RDW: 15.2 % (ref 11.5–15.5)
WBC: 4.8 10*3/uL (ref 4.0–10.5)

## 2022-09-18 LAB — COMPREHENSIVE METABOLIC PANEL
ALT: 9 U/L (ref 0–35)
AST: 19 U/L (ref 0–37)
Albumin: 4 g/dL (ref 3.5–5.2)
Alkaline Phosphatase: 92 U/L (ref 39–117)
BUN: 17 mg/dL (ref 6–23)
CO2: 29 mEq/L (ref 19–32)
Calcium: 9.2 mg/dL (ref 8.4–10.5)
Chloride: 106 mEq/L (ref 96–112)
Creatinine, Ser: 0.99 mg/dL (ref 0.40–1.20)
GFR: 52.13 mL/min — ABNORMAL LOW (ref >60.00)
Glucose, Bld: 106 mg/dL — ABNORMAL HIGH (ref 70–99)
Potassium: 3.9 mEq/L (ref 3.5–5.1)
Sodium: 143 mEq/L (ref 135–145)
Total Bilirubin: 0.5 mg/dL (ref 0.2–1.2)
Total Protein: 6.8 g/dL (ref 6.0–8.3)

## 2022-09-18 LAB — LIPID PANEL
Cholesterol: 229 mg/dL — ABNORMAL HIGH (ref 0–200)
HDL: 73.1 mg/dL (ref >39.00)
LDL Cholesterol: 144 mg/dL — ABNORMAL HIGH (ref 0–99)
NonHDL: 156.21
Total CHOL/HDL Ratio: 3
Triglycerides: 61 mg/dL (ref 0.0–149.0)
VLDL: 12.2 mg/dL (ref 0.0–40.0)

## 2022-09-18 NOTE — Assessment & Plan Note (Signed)
Initial readings upon arrival were extremely high. She has bad white coat HTN. Repeat bp looked OK with manual check. She will also recheck her readings at home this afternoon and contact me with her reading.  Will continue losartan/amlodipine.

## 2022-09-18 NOTE — Progress Notes (Signed)
Subjective:   By signing my name below, I, Shehryar Baig, attest that this documentation has been prepared under the direction and in the presence of Debbrah Alar, NP. 09/18/2022   Patient ID: Brandy Stevens, female    DOB: 06-06-1936, 87 y.o.   MRN: [id number]  Chief Complaint  Patient presents with   Hypertension    Here for follow up    Hypertension   Patient is in today for a follow up visit.   Blood pressure: Her blood pressure is elevated during this visit. She measures her blood pressure at home and reports they typically measure normal range. Her blood pressure yesterday measured 120/64 and all her systolic blood pressure for the month of 09-29-2022 measured under 150. She continues taking 5 mg amlodipine daily PO and 100 mg losartan daily PO and reports no new issues while taking them.  BP Readings from Last 3 Encounters:  09/18/22 135/60  06/07/22 134/84  05/30/22 (!) 190/80   Pulse Readings from Last 3 Encounters:  09/18/22 91  06/05/22 72  05/30/22 83   Vitamin D: She continues taking 2000 units vitamin D daily PO.   Calcium: She continues taking calcium supplements 2x daily PO. Her last bone density levels showed she is osteoporotic.   Iron: She continues taking iron supplements. Lab Results  Component Value Date   IRON 43 05/01/2018   TIBC 322 02/17/2011   FERRITIN 48 10/13/2010   Colace: She is not taking colace at this time.   Immunizations: She was not interested in taking the pneumonia vaccine during last visit and is still not interested in taking it at this time. She did not receive the flu vaccine this year.    Past Medical History:  Diagnosis Date   Allergy    allergic rhinitis   Anemia    nos   History of chicken pox    Hypertension    Osteopenia    Systolic murmur    Normal valves on 2-d echo 5/11    Past Surgical History:  Procedure Laterality Date   MOUTH SURGERY  05/29/2022   RIGHT OOPHORECTOMY  06/26/1966    Family History   Problem Relation Age of Onset   Other Mother        complications from hip fx   Pneumonia Father    Osteoarthritis Brother    Actinic keratosis Brother     Social History   Socioeconomic History   Marital status: Widowed    Spouse name: Not on file   Number of children: 1   Years of education: Not on file   Highest education level: Not on file  Occupational History   Occupation: Optician, dispensing: [employer]  Tobacco Use   Smoking status: Never   Smokeless tobacco: Never  Substance and Sexual Activity   Alcohol use: No   Drug use: No   Sexual activity: Not on file  Other Topics Concern   Not on file  Social History Narrative   7 brothers-- 1 died from unknown cause, 1 died from pneumonia. 5 still living.   1 son living- lives locally, he has a son who lives locally-    One great granddaughter   Widowed, husband died in September 28, 2005   Has two outdoor   Enjoys volunteering, Photographer for shopping trips at [employer] Resource Strain: Not on file  Food Insecurity: Not on file  Transportation Needs:  Not on file  Physical Activity: Not on file  Stress: Not on file  Social Connections: Not on file  Intimate Partner Violence: Not on file    Outpatient Medications Prior to Visit  Medication Sig Dispense Refill   amLODipine (NORVASC) 5 MG tablet Take 1 tablet (5 mg total) by mouth daily. 90 tablet 1   Calcium Carbonate-Vitamin D (CALTRATE 600+D) 600-400 MG-UNIT per tablet Take 1 tablet by mouth 2 (two) times daily.     cholecalciferol (VITAMIN D) 1000 UNITS tablet Take 2,000 Units by mouth daily.     COVID-19 mRNA vaccine 2023-2024 (COMIRNATY) syringe Inject into the muscle. 0.3 mL 0   docusate sodium (COLACE) 100 MG capsule Take 1 capsule by mouth 1 to 2 times daily as needed.     ferrous sulfate (FERROUSUL) 325 (65 FE) MG tablet Take 325 mg by mouth daily with breakfast.      losartan (COZAAR) 100 MG  tablet Take 1 tablet (100 mg total) by mouth daily. 90 tablet 1   No facility-administered medications prior to visit.    Allergies  Allergen Reactions   Ace Inhibitors Other (See Comments)    cough    ROS    See HPI  Objective:    Physical Exam Constitutional:      General: She is not in acute distress.    Appearance: Normal appearance. She is not ill-appearing.  HENT:     Head: Normocephalic and atraumatic.     Right Ear: External ear normal.     Left Ear: External ear normal.  Eyes:     Extraocular Movements: Extraocular movements intact.     Pupils: Pupils are equal, round, and reactive to light.  Cardiovascular:     Rate and Rhythm: Normal rate and regular rhythm.     Heart sounds: Murmur heard.     Systolic murmur is present with a grade of 3/6.     No gallop.     Comments: Blood pressure measured 135/65 during manual recheck Pulmonary:     Effort: Pulmonary effort is normal. No respiratory distress.     Breath sounds: Normal breath sounds. No wheezing or rales.  Skin:    General: Skin is warm and dry.  Neurological:     Mental Status: She is alert and oriented to person, place, and time.  Psychiatric:        Judgment: Judgment normal.     BP 135/60   Pulse 91   Temp 98.6 F (37 C) (Oral)   Resp 16   Wt 165 lb (74.8 kg)   SpO2 100%   BMI 29.23 kg/m  Wt Readings from Last 3 Encounters:  09/18/22 165 lb (74.8 kg)  06/05/22 161 lb (73 kg)  05/30/22 156 lb (70.8 kg)       Assessment & Plan:  Vitamin D deficiency Assessment & Plan: Continues vit D 2000 iu   Osteopenia, unspecified location Assessment & Plan: Continues calcium/vit D. Dexa up to date.   Hyperlipidemia, unspecified hyperlipidemia type Assessment & Plan: Not on statin.  Lab Results  Component Value Date   CHOL 191 07/15/2014   HDL 61.90 07/15/2014   LDLCALC 115 (H) 07/15/2014   TRIG 72.0 07/15/2014   CHOLHDL 3 07/15/2014     Orders: -     Lipid panel -      Comprehensive metabolic panel  Iron deficiency anemia, unspecified iron deficiency anemia type -     Iron, TIBC and Ferritin Panel -  CBC with Differential/Platelet  Essential hypertension Assessment & Plan: Initial readings upon arrival were extremely high. She has bad white coat HTN. Repeat bp looked OK with manual check. She will also recheck her readings at home this afternoon and contact me with her reading.  Will continue losartan/amlodipine.      I, Nance Pear, NP, personally preformed the services described in this documentation.  All medical record entries made by the scribe were at my direction and in my presence.  I have reviewed the chart and discharge instructions (if applicable) and agree that the record reflects my personal performance and is accurate and complete. 09/18/2022   I,Shehryar Baig,acting as a Education administrator for Nance Pear, NP.,have documented all relevant documentation on the behalf of Nance Pear, NP,as directed by  Nance Pear, NP while in the presence of Nance Pear, NP.   Nance Pear, NP

## 2022-09-18 NOTE — Assessment & Plan Note (Signed)
Continues iron supplementation. Recheck labs as below.

## 2022-09-18 NOTE — Assessment & Plan Note (Signed)
Continues calcium/vit D. Dexa up to date.

## 2022-09-18 NOTE — Assessment & Plan Note (Signed)
Continues vit D 2000 iu

## 2022-09-18 NOTE — Assessment & Plan Note (Signed)
Not on statin.  Lab Results  Component Value Date   CHOL 191 07/15/2014   HDL 61.90 07/15/2014   LDLCALC 115 (H) 07/15/2014   TRIG 72.0 07/15/2014   CHOLHDL 3 07/15/2014

## 2022-09-19 ENCOUNTER — Telehealth: Payer: Self-pay | Admitting: Family

## 2022-09-19 LAB — IRON,TIBC AND FERRITIN PANEL
%SAT: 20 % (calc) (ref 16–45)
Ferritin: 68 ng/mL (ref 16–288)
Iron: 58 ug/dL (ref 45–160)
TIBC: 295 mcg/dL (calc) (ref 250–450)

## 2022-09-19 NOTE — Telephone Encounter (Signed)
Noted  

## 2022-09-19 NOTE — Telephone Encounter (Signed)
Pt called to report her blood pressure readings:   3/25- 131/59 with pulse of 85 3/26- Left arm: 131/66, pulse of 74          Right arm: 131/68, pulse of 71

## 2022-11-21 ENCOUNTER — Other Ambulatory Visit: Payer: Self-pay | Admitting: Family

## 2022-12-08 ENCOUNTER — Other Ambulatory Visit: Payer: Self-pay

## 2022-12-08 MED ORDER — AMLODIPINE BESYLATE 5 MG PO TABS: 5.0000 mg | ORAL_TABLET | Freq: Every day | ORAL | 1 refills | Status: DC

## 2022-12-19 ENCOUNTER — Ambulatory Visit: Payer: Federal, State, Local not specified - PPO | Admitting: Family

## 2022-12-19 VITALS — BP 180/85 | HR 83 | Temp 98.5°F | Resp 16 | Wt 160.0 lb

## 2022-12-19 DIAGNOSIS — D509 Iron deficiency anemia, unspecified: Secondary | ICD-10-CM | POA: Diagnosis not present

## 2022-12-19 DIAGNOSIS — I10 Essential (primary) hypertension: Secondary | ICD-10-CM

## 2022-12-19 LAB — BASIC METABOLIC PANEL
BUN: 19 mg/dL (ref 6–23)
CO2: 30 mEq/L (ref 19–32)
Calcium: 9.2 mg/dL (ref 8.4–10.5)
Chloride: 105 mEq/L (ref 96–112)
Creatinine, Ser: 0.9 mg/dL (ref 0.40–1.20)
GFR: 58.34 mL/min — ABNORMAL LOW (ref >60.00)
Glucose, Bld: 115 mg/dL — ABNORMAL HIGH (ref 70–99)
Potassium: 3.6 mEq/L (ref 3.5–5.1)
Sodium: 142 mEq/L (ref 135–145)

## 2022-12-19 MED ORDER — VALSARTAN 320 MG PO TABS
320.0000 mg | ORAL_TABLET | Freq: Every day | ORAL | 1 refills | Status: DC
Start: 2022-12-19 — End: 2023-07-26

## 2022-12-19 NOTE — Assessment & Plan Note (Signed)
Iron levels were normal at last check 3 months ago. Patient is adherent to iron supplementation. -Continue iron supplementation.  No changes.

## 2022-12-19 NOTE — Assessment & Plan Note (Signed)
Elevated office blood pressure (180/85) despite adherence to Losartan 100mg  and Amlodipine 5mg . Home blood pressure readings variable but generally lower. Possible white coat hypertension. -Discontinue Losartan and initiate Valsartan. -Recheck blood pressure in one week. -Consider increasing Amlodipine at next visit if blood pressure remains elevated. -Advise patient to bring home blood pressure monitor to next visit for calibration.

## 2022-12-19 NOTE — Patient Instructions (Signed)
VISIT SUMMARY:  During your visit, we discussed your concerns about your blood pressure medication and your iron supplement. Your blood pressure was higher in the office than at home, which could be due to anxiety in a medical setting (known as 'white coat hypertension'). Your iron levels were normal at your last check 3 months ago.  YOUR PLAN:  -HYPERTENSION: We will change your blood pressure medication from Losartan to Valsartan. This is another medication that helps lower blood pressure. We will also consider increasing your Amlodipine dosage if your blood pressure remains high. Please bring your home blood pressure monitor to your next visit so we can make sure it's accurate.  -IRON DEFICIENCY ANEMIA: You should continue taking your iron supplement as your iron levels were normal at your last check.   INSTRUCTIONS:  Please start taking Valsartan instead of Losartan for your blood pressure. We will recheck your blood pressure in one week. Continue taking your iron supplement and we will recheck your iron levels with lab work. Remember to bring your home blood pressure monitor to your next visit.

## 2022-12-19 NOTE — Progress Notes (Signed)
Subjective:     Patient ID: Brandy Stevens, female    DOB: 02/18/1936, 87 y.o.   MRN: [id number]  Chief Complaint  Patient presents with   Hypertension    Here for follow up    Hypertension    Discussed the use of AI scribe software for clinical note transcription with the patient, who gave verbal consent to proceed.  History of Present Illness   The patient, with a history of hypertension, presents with concerns about her blood pressure medication regimen. She is currently on losartan 100mg  and amlodipine 5mg . She reports a recent issue with her pharmacy regarding the timing of her medication refills, which led to her refusing to pick up her medications. She has about three doses left of her current medications. Previously, she was on losartan 50mg , which she doubled to achieve the current dose of 100mg . She reports no symptoms of high blood pressure such as headaches.  She also reports taking an iron supplement, which was prescribed due to previous low iron levels. Her blood pressure readings at home have been mostly in the 130s, with a few readings in the 140s.          Health Maintenance Due  Topic Date Due   Zoster Vaccines- Shingrix (1 of 2) Never done   DTaP/Tdap/Td (2 - Tdap) 08/26/2019    Past Medical History:  Diagnosis Date   Allergy    allergic rhinitis   Anemia    nos   History of chicken pox    Hypertension    Osteopenia    Systolic murmur    Normal valves on 2-d echo 5/11    Past Surgical History:  Procedure Laterality Date   MOUTH SURGERY  05/29/2022   RIGHT OOPHORECTOMY  06/26/1966    Family History  Problem Relation Age of Onset   Other Mother        complications from hip fx   Pneumonia Father    Osteoarthritis Brother    Actinic keratosis Brother     Social History   Socioeconomic History   Marital status: Widowed    Spouse name: Not on file   Number of children: 1   Years of education: Not on file   Highest education level: Not  on file  Occupational History   Occupation: Academic librarian: [employer]  Tobacco Use   Smoking status: Never   Smokeless tobacco: Never  Substance and Sexual Activity   Alcohol use: No   Drug use: No   Sexual activity: Not on file  Other Topics Concern   Not on file  Social History Narrative   7 brothers-- 1 died from unknown cause, 1 died from pneumonia. 5 still living.   1 son living- lives locally, he has a son who lives locally-    One great granddaughter   Widowed, husband died in January 31, 2006   Has two outdoor   Enjoys volunteering, Market researcher for shopping trips at [employer]   Financial Resource Strain: Not on file  Food Insecurity: Not on file  Transportation Needs: Not on file  Physical Activity: Not on file  Stress: Not on file  Social Connections: Not on file  Intimate Partner Violence: Not on file    Outpatient Medications Prior to Visit  Medication Sig Dispense Refill   amLODipine (NORVASC) 5 MG tablet Take 1 tablet (5 mg total) by mouth daily. 90 tablet 1   Calcium  Carbonate-Vitamin D (CALTRATE 600+D) 600-400 MG-UNIT per tablet Take 1 tablet by mouth 2 (two) times daily.     cholecalciferol (VITAMIN D) 1000 UNITS tablet Take 2,000 Units by mouth daily.     COVID-19 mRNA vaccine 2023-2024 (COMIRNATY) syringe Inject into the muscle. 0.3 mL 0   docusate sodium (COLACE) 100 MG capsule Take 1 capsule by mouth 1 to 2 times daily as needed.     ferrous sulfate (FERROUSUL) 325 (65 FE) MG tablet Take 325 mg by mouth daily with breakfast.      losartan (COZAAR) 100 MG tablet TAKE 1 TABLET(100 MG) BY MOUTH DAILY 90 tablet 1   No facility-administered medications prior to visit.    Allergies  Allergen Reactions   Ace Inhibitors Other (See Comments)    cough    ROS See HPI    Objective:    Physical Exam Constitutional:      Appearance: Normal appearance.  Musculoskeletal:     Right lower leg: Edema  (2+ bilateral pedal edema) present.     Left lower leg: Edema present.  Skin:    General: Skin is warm and dry.  Neurological:     Mental Status: She is alert and oriented to person, place, and time.  Psychiatric:        Mood and Affect: Mood normal.        Behavior: Behavior normal.      BP (!) 180/85   Pulse 83   Temp 98.5 F (36.9 C) (Oral)   Resp 16   Wt 160 lb (72.6 kg)   SpO2 99%   BMI 28.34 kg/m  Wt Readings from Last 3 Encounters:  12/19/22 160 lb (72.6 kg)  09/18/22 165 lb (74.8 kg)  06/05/22 161 lb (73 kg)       Assessment & Plan:   Problem List Items Addressed This Visit       Unprioritized   Essential hypertension - Primary    Elevated office blood pressure (180/85) despite adherence to Losartan 100mg  and Amlodipine 5mg . Home blood pressure readings variable but generally lower. Possible white coat hypertension. -Discontinue Losartan and initiate Valsartan. -Recheck blood pressure in one week. -Consider increasing Amlodipine at next visit if blood pressure remains elevated. -Advise patient to bring home blood pressure monitor to next visit for calibration.       Relevant Medications   valsartan (DIOVAN) 320 MG tablet   Other Relevant Orders   Basic Metabolic Panel (BMET)   Anemia, iron deficiency     Iron levels were normal at last check 3 months ago. Patient is adherent to iron supplementation. -Continue iron supplementation.  No changes.        I have discontinued Anabel Bene. Puopolo's losartan. I am also having her start on valsartan. Additionally, I am having her maintain her docusate sodium, ferrous sulfate, Calcium Carbonate-Vitamin D, cholecalciferol, Comirnaty, and amLODipine.  Meds ordered this encounter  Medications   valsartan (DIOVAN) 320 MG tablet    Sig: Take 1 tablet (320 mg total) by mouth daily.    Dispense:  90 tablet    Refill:  1    Order Specific Question:   Supervising Provider    Answer:   Danise Edge A [[id number]]

## 2022-12-20 ENCOUNTER — Other Ambulatory Visit: Payer: Self-pay

## 2022-12-20 ENCOUNTER — Telehealth: Payer: Self-pay | Admitting: Family

## 2022-12-20 MED ORDER — AMLODIPINE BESYLATE 5 MG PO TABS: 5.0000 mg | ORAL_TABLET | Freq: Every day | ORAL | 1 refills | Status: DC

## 2022-12-20 NOTE — Telephone Encounter (Signed)
Pt states the pharmacy has no record of the amlodipine sent on 6/14 so she would like it resent.   [facility] #[id number] - [place], [place] - [address] AT [facility] [address], [place] [address] Phone: [phone]  Fax: [phone]

## 2022-12-20 NOTE — Telephone Encounter (Signed)
Prescription sent for patient 

## 2022-12-26 ENCOUNTER — Ambulatory Visit: Payer: Federal, State, Local not specified - PPO | Admitting: Family

## 2022-12-26 VITALS — BP 152/65 | HR 84 | Temp 98.2°F | Resp 18 | Ht 63.0 in | Wt 160.0 lb

## 2022-12-26 DIAGNOSIS — I10 Essential (primary) hypertension: Secondary | ICD-10-CM

## 2022-12-26 DIAGNOSIS — R609 Edema, unspecified: Secondary | ICD-10-CM

## 2022-12-26 DIAGNOSIS — R011 Cardiac murmur, unspecified: Secondary | ICD-10-CM

## 2022-12-26 LAB — BASIC METABOLIC PANEL
BUN: 22 mg/dL (ref 6–23)
CO2: 29 mEq/L (ref 19–32)
Calcium: 9 mg/dL (ref 8.4–10.5)
Chloride: 104 mEq/L (ref 96–112)
Creatinine, Ser: 1.01 mg/dL (ref 0.40–1.20)
GFR: 50.8 mL/min — ABNORMAL LOW (ref >60.00)
Glucose, Bld: 113 mg/dL — ABNORMAL HIGH (ref 70–99)
Potassium: 4 mEq/L (ref 3.5–5.1)
Sodium: 141 mEq/L (ref 135–145)

## 2022-12-26 LAB — BRAIN NATRIURETIC PEPTIDE: Pro B Natriuretic peptide (BNP): 134 pg/mL — ABNORMAL HIGH (ref 0.0–100.0)

## 2022-12-26 MED ORDER — HYDROCHLOROTHIAZIDE 25 MG PO TABS
25.0000 mg | ORAL_TABLET | Freq: Every day | ORAL | 1 refills | Status: DC
Start: 2022-12-26 — End: 2023-07-26

## 2022-12-26 NOTE — Assessment & Plan Note (Signed)
Remains uncontrolled. I am also concerned about some mild volume overload on exam.  Will obtain follow up bmet today as well as BNP.  Add hydrochlorothiazide 25mg  once daily for swelling/BP control.

## 2022-12-26 NOTE — Patient Instructions (Signed)
Please add hydrochlorothiazide 25mg  once daily for blood pressure and swelling.

## 2022-12-26 NOTE — Progress Notes (Signed)
Subjective:     Patient ID: Brandy Stevens, female    DOB: July 06, 1935, 87 y.o.   MRN: [id number]  Chief Complaint  Patient presents with   Follow-up    1 week    HPI  Discussed the use of AI scribe software for clinical note transcription with the patient, who gave verbal consent to proceed.  History of Present Illness         BP Readings from Last 3 Encounters:  12/26/22 (!) 152/65  12/19/22 (!) 180/85  09/18/22 135/60   Patient is an 87 yr old female who presents today for bp follow up.  Last visit we changed losartan to valsartan. She is tolerating without side effects.  Reports that her home readings have been 130-150 sbp.    Reports bp was 143/72 yesterday. She denies SOB.   Health Maintenance Due  Topic Date Due   Zoster Vaccines- Shingrix (1 of 2) Never done   DTaP/Tdap/Td (2 - Tdap) 08/26/2019    Past Medical History:  Diagnosis Date   Allergy    allergic rhinitis   Anemia    nos   History of chicken pox    Hypertension    Osteopenia    Systolic murmur    Normal valves on 2-d echo 5/11    Past Surgical History:  Procedure Laterality Date   MOUTH SURGERY  05/29/2022   RIGHT OOPHORECTOMY  06/26/1966    Family History  Problem Relation Age of Onset   Other Mother        complications from hip fx   Pneumonia Father    Osteoarthritis Brother    Actinic keratosis Brother     Social History   Socioeconomic History   Marital status: Widowed    Spouse name: Not on file   Number of children: 1   Years of education: Not on file   Highest education level: Not on file  Occupational History   Occupation: Academic librarian: [employer]  Tobacco Use   Smoking status: Never   Smokeless tobacco: Never  Substance and Sexual Activity   Alcohol use: No   Drug use: No   Sexual activity: Not on file  Other Topics Concern   Not on file  Social History Narrative   7 brothers-- 1 died from unknown cause, 1 died from pneumonia. 5 still  living.   1 son living- lives locally, he has a son who lives locally-    One great granddaughter   Widowed, husband died in January 11, 2006   Has two outdoor   Enjoys volunteering, Market researcher for shopping trips at [employer]   Financial Resource Strain: Not on file  Food Insecurity: Not on file  Transportation Needs: Not on file  Physical Activity: Not on file  Stress: Not on file  Social Connections: Not on file  Intimate Partner Violence: Not on file    Outpatient Medications Prior to Visit  Medication Sig Dispense Refill   amLODipine (NORVASC) 5 MG tablet Take 1 tablet (5 mg total) by mouth daily. 90 tablet 1   Calcium Carbonate-Vitamin D (CALTRATE 600+D) 600-400 MG-UNIT per tablet Take 1 tablet by mouth 2 (two) times daily.     cholecalciferol (VITAMIN D) 1000 UNITS tablet Take 2,000 Units by mouth daily.     COVID-19 mRNA vaccine 2023-2024 (COMIRNATY) syringe Inject into the muscle. 0.3 mL 0   docusate sodium (COLACE) 100 MG capsule Take 1  capsule by mouth 1 to 2 times daily as needed.     ferrous sulfate (FERROUSUL) 325 (65 FE) MG tablet Take 325 mg by mouth daily with breakfast.      valsartan (DIOVAN) 320 MG tablet Take 1 tablet (320 mg total) by mouth daily. 90 tablet 1   No facility-administered medications prior to visit.    Allergies  Allergen Reactions   Ace Inhibitors Other (See Comments)    cough    ROS    See HPI Objective:    Physical Exam Constitutional:      General: She is not in acute distress.    Appearance: Normal appearance. She is well-developed.  HENT:     Head: Normocephalic and atraumatic.     Right Ear: External ear normal.     Left Ear: External ear normal.  Eyes:     General: No scleral icterus. Neck:     Thyroid: No thyromegaly.  Cardiovascular:     Rate and Rhythm: Normal rate and regular rhythm.     Heart sounds: Murmur heard.     Crescendo systolic murmur is present with a grade of  3/6.  Pulmonary:     Effort: Pulmonary effort is normal. No respiratory distress.     Breath sounds: Examination of the right-lower field reveals rales. Examination of the left-lower field reveals rales. Rales present. No wheezing.  Musculoskeletal:     Cervical back: Neck supple.     Right lower leg: 3+ Edema present.     Left lower leg: 3+ Edema present.  Skin:    General: Skin is warm and dry.  Neurological:     Mental Status: She is alert and oriented to person, place, and time.  Psychiatric:        Mood and Affect: Mood normal.        Behavior: Behavior normal.        Thought Content: Thought content normal.        Judgment: Judgment normal.      BP (!) 152/65   Pulse 84   Temp 98.2 F (36.8 C)   Resp 18   Ht 5\' 3"  (1.6 m)   Wt 160 lb (72.6 kg)   SpO2 100%   BMI 28.34 kg/m  Wt Readings from Last 3 Encounters:  12/26/22 160 lb (72.6 kg)  12/19/22 160 lb (72.6 kg)  09/18/22 165 lb (74.8 kg)       Assessment & Plan:   Problem List Items Addressed This Visit       Unprioritized   Essential hypertension - Primary    Remains uncontrolled. I am also concerned about some mild volume overload on exam.  Will obtain follow up bmet today as well as BNP.  Add hydrochlorothiazide 25mg  once daily for swelling/BP control.       Relevant Medications   hydrochlorothiazide (HYDRODIURIL) 25 MG tablet   Other Relevant Orders   Basic Metabolic Panel (BMET)   Other Visit Diagnoses     Edema, unspecified type       Relevant Orders   B Nat Peptide   Murmur       Relevant Orders   ECHOCARDIOGRAM COMPLETE       I am having Brandy Stevens start on hydrochlorothiazide. I am also having her maintain her docusate sodium, ferrous sulfate, Calcium Carbonate-Vitamin D, cholecalciferol, Comirnaty, valsartan, and amLODipine.  Meds ordered this encounter  Medications   hydrochlorothiazide (HYDRODIURIL) 25 MG tablet    Sig: Take 1 tablet (25  mg total) by mouth daily.     Dispense:  90 tablet    Refill:  1    Order Specific Question:   Supervising Provider    Answer:   Danise Edge A [[id number]]

## 2023-01-02 ENCOUNTER — Ambulatory Visit: Payer: Federal, State, Local not specified - PPO | Admitting: Family

## 2023-01-02 ENCOUNTER — Encounter: Payer: Self-pay | Admitting: Family

## 2023-01-02 VITALS — BP 133/76 | HR 79 | Temp 97.7°F | Ht 63.0 in | Wt 159.0 lb

## 2023-01-02 DIAGNOSIS — I10 Essential (primary) hypertension: Secondary | ICD-10-CM | POA: Diagnosis not present

## 2023-01-02 LAB — BASIC METABOLIC PANEL
BUN: 23 mg/dL (ref 6–23)
CO2: 29 mEq/L (ref 19–32)
Calcium: 9.2 mg/dL (ref 8.4–10.5)
Chloride: 103 mEq/L (ref 96–112)
Creatinine, Ser: 1.11 mg/dL (ref 0.40–1.20)
GFR: 45.35 mL/min — ABNORMAL LOW (ref >60.00)
Glucose, Bld: 132 mg/dL — ABNORMAL HIGH (ref 70–99)
Potassium: 3.8 mEq/L (ref 3.5–5.1)
Sodium: 141 mEq/L (ref 135–145)

## 2023-01-02 NOTE — Assessment & Plan Note (Addendum)
BP here upon arrival was elevated but had bp reading at home this am of 133/76. Home blood pressure readings are generally good, with some elevation noted after showering and walking. Recently switched from Losartan to Valsartan, which is being tolerated well. -Continue monitoring blood pressure at home and report any consistently high readings. -Check potassium level today due to recent medication change. -Follow-up in three months.

## 2023-01-02 NOTE — Progress Notes (Signed)
Subjective:     Patient ID: Brandy Stevens, female    DOB: Oct 15, 1935, 87 y.o.   MRN: [id number]  No chief complaint on file.   HPI  Discussed the use of AI scribe software for clinical note transcription with the patient, who gave verbal consent to proceed.  History of Present Illness   The patient, with a history of hypertension, reports that her blood pressure readings have been 'pretty good so far this week.' She notes that her blood pressure tends to elevate after taking a shower and after walking. She recently transitioned from losartan to valsartan for blood pressure management and reports tolerating the new medication well.          Health Maintenance Due  Topic Date Due   Zoster Vaccines- Shingrix (1 of 2) Never done   DTaP/Tdap/Td (2 - Tdap) 08/26/2019    Past Medical History:  Diagnosis Date   Allergy    allergic rhinitis   Anemia    nos   History of chicken pox    Hypertension    Osteopenia    Systolic murmur    Normal valves on 2-d echo 5/11    Past Surgical History:  Procedure Laterality Date   MOUTH SURGERY  05/29/2022   RIGHT OOPHORECTOMY  06/26/1966    Family History  Problem Relation Age of Onset   Other Mother        complications from hip fx   Pneumonia Father    Osteoarthritis Brother    Actinic keratosis Brother     Social History   Socioeconomic History   Marital status: Widowed    Spouse name: Not on file   Number of children: 1   Years of education: Not on file   Highest education level: Not on file  Occupational History   Occupation: Academic librarian: [employer]  Tobacco Use   Smoking status: Never   Smokeless tobacco: Never  Substance and Sexual Activity   Alcohol use: No   Drug use: No   Sexual activity: Not on file  Other Topics Concern   Not on file  Social History Narrative   7 brothers-- 1 died from unknown cause, 1 died from pneumonia. 5 still living.   1 son living- lives locally, he has a son  who lives locally-    One great granddaughter   Widowed, husband died in 2006-01-11   Has two outdoor   Enjoys volunteering, Market researcher for shopping trips at [employer]   Financial Resource Strain: Not on file  Food Insecurity: Not on file  Transportation Needs: Not on file  Physical Activity: Not on file  Stress: Not on file  Social Connections: Not on file  Intimate Partner Violence: Not on file    Outpatient Medications Prior to Visit  Medication Sig Dispense Refill   amLODipine (NORVASC) 5 MG tablet Take 1 tablet (5 mg total) by mouth daily. 90 tablet 1   Calcium Carbonate-Vitamin D (CALTRATE 600+D) 600-400 MG-UNIT per tablet Take 1 tablet by mouth 2 (two) times daily.     cholecalciferol (VITAMIN D) 1000 UNITS tablet Take 2,000 Units by mouth daily.     COVID-19 mRNA vaccine 2023-2024 (COMIRNATY) syringe Inject into the muscle. 0.3 mL 0   docusate sodium (COLACE) 100 MG capsule Take 1 capsule by mouth 1 to 2 times daily as needed.     ferrous sulfate (FERROUSUL) 325 (65 FE) MG tablet  Take 325 mg by mouth daily with breakfast.      hydrochlorothiazide (HYDRODIURIL) 25 MG tablet Take 1 tablet (25 mg total) by mouth daily. 90 tablet 1   valsartan (DIOVAN) 320 MG tablet Take 1 tablet (320 mg total) by mouth daily. 90 tablet 1   No facility-administered medications prior to visit.    Allergies  Allergen Reactions   Ace Inhibitors Other (See Comments)    cough    ROS     Objective:    Physical Exam Constitutional:      General: She is not in acute distress.    Appearance: Normal appearance. She is well-developed.  HENT:     Head: Normocephalic and atraumatic.     Right Ear: External ear normal.     Left Ear: External ear normal.  Eyes:     General: No scleral icterus. Neck:     Thyroid: No thyromegaly.  Cardiovascular:     Rate and Rhythm: Normal rate and regular rhythm.     Heart sounds: Murmur heard.  Pulmonary:      Effort: Pulmonary effort is normal. No respiratory distress.     Breath sounds: Normal breath sounds. No wheezing.  Musculoskeletal:     Cervical back: Neck supple.  Skin:    General: Skin is warm and dry.  Neurological:     Mental Status: She is alert and oriented to person, place, and time.  Psychiatric:        Mood and Affect: Mood normal.        Behavior: Behavior normal.        Thought Content: Thought content normal.        Judgment: Judgment normal.      BP 133/76 Comment: at home this AM  Pulse 79   Temp 97.7 F (36.5 C)   Ht 5\' 3"  (1.6 m)   Wt 159 lb (72.1 kg)   SpO2 98%   BMI 28.17 kg/m  Wt Readings from Last 3 Encounters:  01/02/23 159 lb (72.1 kg)  12/26/22 160 lb (72.6 kg)  12/19/22 160 lb (72.6 kg)       Assessment & Plan:   Problem List Items Addressed This Visit       Unprioritized   Essential hypertension - Primary    BP here upon arrival was elevated but had bp reading at home this am of 133/76. Home blood pressure readings are generally good, with some elevation noted after showering and walking. Recently switched from Losartan to Valsartan, which is being tolerated well. -Continue monitoring blood pressure at home and report any consistently high readings. -Check potassium level today due to recent medication change. -Follow-up in three months.      Relevant Orders   Basic Metabolic Panel (BMET)     I am having Brandy Bene. Stevens maintain her docusate sodium, ferrous sulfate, Calcium Carbonate-Vitamin D, cholecalciferol, Comirnaty, valsartan, amLODipine, and hydrochlorothiazide.  No orders of the defined types were placed in this encounter.

## 2023-01-17 ENCOUNTER — Ambulatory Visit (HOSPITAL_BASED_OUTPATIENT_CLINIC_OR_DEPARTMENT_OTHER)
Admission: RE | Admit: 2023-01-17 | Discharge: 2023-01-17 | Disposition: A | Discharge status: Home / Self Care | Payer: Federal, State, Local not specified - PPO | Source: Ambulatory Visit | Attending: Family | Admitting: Family

## 2023-01-17 DIAGNOSIS — R011 Cardiac murmur, unspecified: Secondary | ICD-10-CM | POA: Insufficient documentation

## 2023-01-17 LAB — ECHOCARDIOGRAM COMPLETE
AR max vel: 2.17 cm2
AV Area VTI: 1.98 cm2
AV Area mean vel: 2.05 cm2
AV Mean grad: 11 mmHg
AV Peak grad: 16.5 mmHg
Ao pk vel: 2.03 m/s
Area-P 1/2: 2.37 cm2
MV M vel: 5.03 m/s
MV Peak grad: 101.2 mmHg
P 1/2 time: 308 msec
S' Lateral: 3 cm

## 2023-04-06 ENCOUNTER — Ambulatory Visit: Payer: Federal, State, Local not specified - PPO | Admitting: Family

## 2023-04-10 ENCOUNTER — Telehealth: Payer: Self-pay | Admitting: Family

## 2023-04-10 ENCOUNTER — Ambulatory Visit: Payer: Federal, State, Local not specified - PPO | Admitting: Family

## 2023-04-10 VITALS — BP 150/82 | HR 88 | Temp 98.2°F | Resp 16 | Wt 164.0 lb

## 2023-04-10 DIAGNOSIS — I10 Essential (primary) hypertension: Secondary | ICD-10-CM | POA: Diagnosis not present

## 2023-04-10 DIAGNOSIS — Z23 Encounter for immunization: Secondary | ICD-10-CM | POA: Diagnosis not present

## 2023-04-10 DIAGNOSIS — M8580 Other specified disorders of bone density and structure, unspecified site: Secondary | ICD-10-CM

## 2023-04-10 DIAGNOSIS — D509 Iron deficiency anemia, unspecified: Secondary | ICD-10-CM | POA: Diagnosis not present

## 2023-04-10 DIAGNOSIS — R011 Cardiac murmur, unspecified: Secondary | ICD-10-CM

## 2023-04-10 DIAGNOSIS — I421 Obstructive hypertrophic cardiomyopathy: Secondary | ICD-10-CM

## 2023-04-10 DIAGNOSIS — E559 Vitamin D deficiency, unspecified: Secondary | ICD-10-CM | POA: Diagnosis not present

## 2023-04-10 LAB — CBC WITH DIFFERENTIAL/PLATELET
Basophils Absolute: 0 10*3/uL (ref 0.0–0.1)
Basophils Relative: 0.5 % (ref 0.0–3.0)
Eosinophils Absolute: 0.2 10*3/uL (ref 0.0–0.7)
Eosinophils Relative: 3.4 % (ref 0.0–5.0)
HCT: 34.5 % — ABNORMAL LOW (ref 36.0–46.0)
Hemoglobin: 10.9 g/dL — ABNORMAL LOW (ref 12.0–15.0)
Lymphocytes Relative: 35.5 % (ref 12.0–46.0)
Lymphs Abs: 1.6 10*3/uL (ref 0.7–4.0)
MCHC: 31.7 g/dL (ref 30.0–36.0)
MCV: 87.2 fL (ref 78.0–100.0)
Monocytes Absolute: 0.4 10*3/uL (ref 0.1–1.0)
Monocytes Relative: 7.7 % (ref 3.0–12.0)
Neutro Abs: 2.4 10*3/uL (ref 1.4–7.7)
Neutrophils Relative %: 52.9 % (ref 43.0–77.0)
Platelets: 214 10*3/uL (ref 150.0–400.0)
RBC: 3.96 Mil/uL (ref 3.87–5.11)
RDW: 15 % (ref 11.5–15.5)
WBC: 4.6 10*3/uL (ref 4.0–10.5)

## 2023-04-10 LAB — VITAMIN D 25 HYDROXY (VIT D DEFICIENCY, FRACTURES): VITD: 19.76 ng/mL — ABNORMAL LOW (ref 30.00–100.00)

## 2023-04-10 NOTE — Telephone Encounter (Signed)
See mychart.  

## 2023-04-10 NOTE — Assessment & Plan Note (Signed)
Tolerating iron 325mg  one tab by mouth every other day. Update cbc, iron studies.   Flu shot today.

## 2023-04-10 NOTE — Patient Instructions (Signed)
VISIT SUMMARY:  During your recent visit, we discussed your hypertension, vitamin D supplementation, osteopenia, and iron supplementation. You reported variable blood pressure readings at home and some stomach discomfort with your current medication, Valsartan. We also discussed your daily vitamin D and Caltrate intake for bone health, and your iron supplementation. You mentioned that you plan to get your flu shot today and are considering a COVID-19 booster shot.  YOUR PLAN:  -HYPERTENSION: Hypertension is a condition where the force of the blood against the artery walls is too high. We will continue your current blood pressure medication, Valsartan, and you can try taking it at bedtime to help with the nausea. Keep monitoring your blood pressure at home.  -VITAMIN D SUPPLEMENTATION: Vitamin D is important for bone health. We will order a Vitamin D level test to check if your current supplementation is adequate.  -OSTEOPENIA: Osteopenia is a condition where bone density is lower than normal, but not low enough to be classified as osteoporosis. Continue taking Caltrate 600 twice daily for bone health.  -IRON SUPPLEMENTATION: Iron is important for the production of red blood cells. Continue your current iron supplementation regimen.  INSTRUCTIONS:  Please continue to monitor your blood pressure at home and return in six months for a follow-up. We will administer your flu shot today and recommend getting a COVID-19 booster shot. We will also order a Vitamin D level test to check the adequacy of your current supplementation.

## 2023-04-10 NOTE — Assessment & Plan Note (Signed)
Continue caltrate 600mg  bid.

## 2023-04-10 NOTE — Assessment & Plan Note (Signed)
She has not seen cardiology is some time. Will arrange follow up consultation.

## 2023-04-10 NOTE — Assessment & Plan Note (Addendum)
BP is always very high here in the office.  AM bp was acceptable today at home with SBP 150.  I advised pt to continue monitoring her bp at home.  Call if home bp runs >150.  Continue amlodipine, hydrochlorothiazide and valsartan (she notes some nausea with valsartan so I recommended that she try taking this at bedtime to see if she can better tolerate).

## 2023-04-10 NOTE — Assessment & Plan Note (Signed)
Continues otc vit D 2000 international units daily. Update level.

## 2023-04-10 NOTE — Addendum Note (Signed)
Addended by: Wilford Corner on: 04/10/2023 09:46 AM   Modules accepted: Orders

## 2023-04-10 NOTE — Assessment & Plan Note (Signed)
Moderate aortic valve regurgitation noted on echo.  Appears clinically compensated.

## 2023-04-10 NOTE — Progress Notes (Signed)
Subjective:     Patient ID: Audie Box, female    DOB: 15-Oct-1935, 87 y.o.   MRN: [id number]  Chief Complaint  Patient presents with   Hypertension    Here for follow up    Hypertension    Discussed the use of AI scribe software for clinical note transcription with the patient, who gave verbal consent to proceed.  History of Present Illness   The patient, with a history of hypertension, presents for a routine follow-up. She reports variable blood pressure readings at home, ranging from 120/70 to 150/82. She has been taking Valsartan, which she reports has been effective in lowering her blood pressure. However, she has experienced some stomach discomfort and slight nausea since starting the medication. She has tried taking it with and without food, but the symptoms persist. She has not experienced any vomiting. She also reports taking Colace as needed for constipation, but this is not a regular occurrence.  The patient also takes over-the-counter Vitamin D daily and Caltrate 600 twice daily for bone health. Her last bone density scan in January showed some bone thinning, but not severe. She also takes iron every other day. She has not yet received her COVID booster shot, but she does plan to get her flu shot today. She reports a small amount of swelling in her ankles, which is a known side effect of her blood pressure medication, Amlodipine.      BP Readings from Last 3 Encounters:  04/10/23 (!) 150/82  01/02/23 133/76  12/26/22 (!) 152/65      Health Maintenance Due  Topic Date Due   Zoster Vaccines- Shingrix (1 of 2) Never done   DTaP/Tdap/Td (2 - Tdap) 08/26/2019   INFLUENZA VACCINE  01/25/2023    Past Medical History:  Diagnosis Date   Allergy    allergic rhinitis   Anemia    nos   History of chicken pox    Hypertension    Osteopenia    Systolic murmur    Normal valves on 2-d echo 5/11    Past Surgical History:  Procedure Laterality Date   MOUTH SURGERY   05/29/2022   RIGHT OOPHORECTOMY  06/26/1966    Family History  Problem Relation Age of Onset   Other Mother        complications from hip fx   Pneumonia Father    Osteoarthritis Brother    Actinic keratosis Brother     Social History   Socioeconomic History   Marital status: Widowed    Spouse name: Not on file   Number of children: 1   Years of education: Not on file   Highest education level: Not on file  Occupational History   Occupation: Academic librarian: [employer]  Tobacco Use   Smoking status: Never   Smokeless tobacco: Never  Substance and Sexual Activity   Alcohol use: No   Drug use: No   Sexual activity: Not on file  Other Topics Concern   Not on file  Social History Narrative   7 brothers-- 1 died from unknown cause, 1 died from pneumonia. 5 still living.   1 son living- lives locally, he has a son who lives locally-    One great granddaughter   Widowed, husband died in 2006/05/07   Has two outdoor   Enjoys volunteering, Market researcher for shopping trips at [employer] Strain: Not on file  Food Insecurity: Not on file  Transportation Needs: Not on file  Physical Activity: Not on file  Stress: Not on file  Social Connections: Not on file  Intimate Partner Violence: Not on file    Outpatient Medications Prior to Visit  Medication Sig Dispense Refill   amLODipine (NORVASC) 5 MG tablet Take 1 tablet (5 mg total) by mouth daily. 90 tablet 1   Calcium Carbonate-Vitamin D (CALTRATE 600+D) 600-400 MG-UNIT per tablet Take 1 tablet by mouth 2 (two) times daily.     cholecalciferol (VITAMIN D) 1000 UNITS tablet Take 2,000 Units by mouth daily.     COVID-19 mRNA vaccine 2023-2024 (COMIRNATY) syringe Inject into the muscle. 0.3 mL 0   docusate sodium (COLACE) 100 MG capsule Take 1 capsule by mouth 1 to 2 times daily as needed.     ferrous sulfate (FERROUSUL) 325 (65 FE) MG tablet Take 325  mg by mouth daily with breakfast.      hydrochlorothiazide (HYDRODIURIL) 25 MG tablet Take 1 tablet (25 mg total) by mouth daily. 90 tablet 1   valsartan (DIOVAN) 320 MG tablet Take 1 tablet (320 mg total) by mouth daily. 90 tablet 1   No facility-administered medications prior to visit.    Allergies  Allergen Reactions   Ace Inhibitors Other (See Comments)    cough    ROS See HPI    Objective:    Physical Exam Constitutional:      General: She is not in acute distress.    Appearance: Normal appearance. She is well-developed.  HENT:     Head: Normocephalic and atraumatic.     Right Ear: External ear normal.     Left Ear: External ear normal.  Eyes:     General: No scleral icterus. Neck:     Thyroid: No thyromegaly.  Cardiovascular:     Rate and Rhythm: Normal rate and regular rhythm.     Heart sounds: Murmur heard.     Systolic murmur is present.  Pulmonary:     Effort: Pulmonary effort is normal. No respiratory distress.     Breath sounds: Normal breath sounds. No wheezing.  Musculoskeletal:     Cervical back: Neck supple.     Right lower leg: 2+ Edema present.     Left lower leg: 2+ Edema present.  Skin:    General: Skin is warm and dry.  Neurological:     Mental Status: She is alert and oriented to person, place, and time.  Psychiatric:        Mood and Affect: Mood normal.        Behavior: Behavior normal.        Thought Content: Thought content normal.        Judgment: Judgment normal.      BP (!) 150/82 Comment: home reading today prior to visit  Pulse 88   Temp 98.2 F (36.8 C) (Oral)   Resp 16   Wt 164 lb (74.4 kg)   SpO2 100%   BMI 29.05 kg/m  Wt Readings from Last 3 Encounters:  04/10/23 164 lb (74.4 kg)  01/02/23 159 lb (72.1 kg)  12/26/22 160 lb (72.6 kg)       Assessment & Plan:   Problem List Items Addressed This Visit       Unprioritized   Vitamin D deficiency - Primary    Continues otc vit D 2000 international units daily.  Update level.      Relevant Orders   Vitamin D (25 hydroxy)  Osteopenia    Continue caltrate 600mg  bid.      Hypertrophic obstructive cardiomyopathy (HCC)    She has not seen cardiology is some time. Will arrange follow up consultation.       Relevant Orders   Ambulatory referral to Cardiology   HEART MURMUR, SYSTOLIC    Moderate aortic valve regurgitation noted on echo.  Appears clinically compensated.       Relevant Orders   Ambulatory referral to Cardiology   Essential hypertension    BP is always very high here in the office.  AM bp was acceptable today at home with SBP 150.  I advised pt to continue monitoring her bp at home.  Call if home bp runs >150.  Continue amlodipine, hydrochlorothiazide and valsartan (she notes some nausea with valsartan so I recommended that she try taking this at bedtime to see if she can better tolerate).      Anemia, iron deficiency    Tolerating iron 325mg  one tab by mouth every other day. Update cbc, iron studies.   Flu shot today.      Relevant Orders   CBC w/Diff   Iron, TIBC and Ferritin Panel    I am having Anabel Bene. Korte maintain her docusate sodium, ferrous sulfate, Calcium Carbonate-Vitamin D, cholecalciferol, Comirnaty, valsartan, amLODipine, and hydrochlorothiazide.  No orders of the defined types were placed in this encounter.

## 2023-04-11 LAB — IRON,TIBC AND FERRITIN PANEL
%SAT: 17 % (ref 16–45)
Ferritin: 60 ng/mL (ref 16–288)
Iron: 48 ug/dL (ref 45–160)
TIBC: 282 ug/dL (ref 250–450)

## 2023-07-25 ENCOUNTER — Other Ambulatory Visit: Payer: Self-pay | Admitting: Family

## 2023-07-25 DIAGNOSIS — I10 Essential (primary) hypertension: Secondary | ICD-10-CM

## 2023-10-09 ENCOUNTER — Ambulatory Visit: Payer: Federal, State, Local not specified - PPO | Admitting: Family

## 2023-10-17 ENCOUNTER — Ambulatory Visit: Admitting: Family

## 2023-10-17 VITALS — BP 136/74 | HR 83 | Temp 98.6°F | Resp 16 | Ht 63.0 in | Wt 157.0 lb

## 2023-10-17 DIAGNOSIS — R011 Cardiac murmur, unspecified: Secondary | ICD-10-CM | POA: Diagnosis not present

## 2023-10-17 DIAGNOSIS — D509 Iron deficiency anemia, unspecified: Secondary | ICD-10-CM | POA: Diagnosis not present

## 2023-10-17 DIAGNOSIS — I10 Essential (primary) hypertension: Secondary | ICD-10-CM
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
## 2023-10-18 ENCOUNTER — Telehealth: Payer: Self-pay | Admitting: Family

## 2023-10-18 DIAGNOSIS — N1832 Chronic kidney disease, stage 3b: Secondary | ICD-10-CM

## 2023-10-18 LAB — CBC WITH DIFFERENTIAL/PLATELET
Basophils Absolute: 0 10*3/uL (ref 0.0–0.1)
Basophils Relative: 0.5 % (ref 0.0–3.0)
Eosinophils Absolute: 0.1 10*3/uL (ref 0.0–0.7)
Eosinophils Relative: 1.7 % (ref 0.0–5.0)
HCT: 34.2 % — ABNORMAL LOW (ref 36.0–46.0)
Hemoglobin: 11 g/dL — ABNORMAL LOW (ref 12.0–15.0)
Lymphocytes Relative: 30 % (ref 12.0–46.0)
Lymphs Abs: 1.4 10*3/uL (ref 0.7–4.0)
MCHC: 32.3 g/dL (ref 30.0–36.0)
MCV: 85.9 fl (ref 78.0–100.0)
Monocytes Absolute: 0.4 10*3/uL (ref 0.1–1.0)
Monocytes Relative: 8.6 % (ref 3.0–12.0)
Neutro Abs: 2.7 10*3/uL (ref 1.4–7.7)
Neutrophils Relative %: 59.2 % (ref 43.0–77.0)
Platelets: 206 10*3/uL (ref 150.0–400.0)
RBC: 3.98 Mil/uL (ref 3.87–5.11)
RDW: 15.4 % (ref 11.5–15.5)
WBC: 4.6 10*3/uL (ref 4.0–10.5)

## 2023-10-18 LAB — BASIC METABOLIC PANEL WITH GFR
BUN: 29 mg/dL — ABNORMAL HIGH (ref 6–23)
CO2: 29 meq/L (ref 19–32)
Calcium: 9.2 mg/dL (ref 8.4–10.5)
Chloride: 104 meq/L (ref 96–112)
Creatinine, Ser: 1.18 mg/dL (ref 0.40–1.20)
GFR: 41.91 mL/min — ABNORMAL LOW (ref >60.00)
Glucose, Bld: 94 mg/dL (ref 70–99)
Potassium: 4.1 meq/L (ref 3.5–5.1)
Sodium: 142 meq/L (ref 135–145)
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
## 2023-10-18 NOTE — Assessment & Plan Note (Signed)
 Diagnosed with moderate aortic valve regurgitation, asymptomatic. - Monitor for symptoms such as shortness of breath or swelling.

## 2023-10-18 NOTE — Assessment & Plan Note (Signed)
 Home reading much better than office reading today. Continue valsartan /hydrochlorothiazide /amlodipine .

## 2023-10-18 NOTE — Telephone Encounter (Signed)
 Kidney function has decreased a bit more.  I would like her to see the kidney specialist.

## 2023-10-18 NOTE — Assessment & Plan Note (Signed)
 Continues iron supplement. Update CBC.

## 2023-10-19 NOTE — Telephone Encounter (Signed)
 Called home and cell numbers but no answer and no voice mail available.

## 2023-10-22 NOTE — Telephone Encounter (Signed)
 Pt notified of note below and advised to be on the lookout for a call from kidney specialist.

## 2023-11-15 ENCOUNTER — Other Ambulatory Visit: Payer: Self-pay | Admitting: Family

## 2023-11-15 DIAGNOSIS — I10 Essential (primary) hypertension: Secondary | ICD-10-CM

## 2023-11-15 MED ORDER — HYDROCHLOROTHIAZIDE 25 MG PO TABS
25.0000 mg | ORAL_TABLET | Freq: Every day | ORAL | 1 refills | Status: DC
Start: 2023-11-15 — End: 2024-04-18

## 2023-11-15 NOTE — Telephone Encounter (Signed)
 Copied from CRM [id number]. Topic: Clinical - Medication Refill >> Nov 15, 2023  4:07 PM Juleen Oakland F wrote: Medication: hydrochlorothiazide  (HYDRODIURIL ) 25 MG tablet  Has the patient contacted their pharmacy? Yes (Agent: If no, request that the patient contact the pharmacy for the refill. If patient does not wish to contact the pharmacy document the reason why and proceed with request.) (Agent: If yes, when and what did the pharmacy advise?)  This is the patient's preferred pharmacy:  [id number] - [place], [place] - [address] AT [facility] [address] [place] [address] Phone: [phone] Fax: [phone]   Is this the correct pharmacy for this prescription? Yes If no, delete pharmacy and type the correct one.   Has the prescription been filled recently? Yes  Is the patient out of the medication? Yes  Has the patient been seen for an appointment in the last year OR does the patient have an upcoming appointment? Yes  Can we respond through MyChart? No  Agent: Please be advised that Rx refills may take up to 3 business days. We ask that you follow-up with your pharmacy.

## 2024-04-18 ENCOUNTER — Ambulatory Visit: Admitting: Family

## 2024-04-18 VITALS — BP 143/63 | HR 88 | Temp 98.5°F | Resp 16 | Ht 63.0 in | Wt 157.0 lb

## 2024-04-18 DIAGNOSIS — D509 Iron deficiency anemia, unspecified: Secondary | ICD-10-CM | POA: Diagnosis not present

## 2024-04-18 DIAGNOSIS — E559 Vitamin D deficiency, unspecified: Secondary | ICD-10-CM

## 2024-04-18 DIAGNOSIS — I10 Essential (primary) hypertension: Secondary | ICD-10-CM | POA: Diagnosis not present

## 2024-04-18 DIAGNOSIS — Z23 Encounter for immunization: Secondary | ICD-10-CM | POA: Diagnosis not present

## 2024-04-18 DIAGNOSIS — N1832 Chronic kidney disease, stage 3b: Secondary | ICD-10-CM | POA: Diagnosis not present

## 2024-04-18 DIAGNOSIS — E785 Hyperlipidemia, unspecified: Secondary | ICD-10-CM | POA: Diagnosis not present

## 2024-04-18 DIAGNOSIS — M8580 Other specified disorders of bone density and structure, unspecified site: Secondary | ICD-10-CM

## 2024-04-18 LAB — CBC WITH DIFFERENTIAL/PLATELET
Absolute Lymphocytes: 1496 {cells}/uL (ref 850–3900)
Absolute Monocytes: 361 {cells}/uL (ref 200–950)
Basophils Absolute: 40 {cells}/uL (ref 0–200)
Basophils Relative: 0.9 %
Eosinophils Absolute: 101 {cells}/uL (ref 15–500)
Eosinophils Relative: 2.3 %
HCT: 34.4 % — ABNORMAL LOW (ref 35.0–45.0)
Hemoglobin: 10.7 g/dL — ABNORMAL LOW (ref 11.7–15.5)
MCH: 27.9 pg (ref 27.0–33.0)
MCHC: 31.1 g/dL — ABNORMAL LOW (ref 32.0–36.0)
MCV: 89.6 fL (ref 80.0–100.0)
MPV: 10.9 fL (ref 7.5–12.5)
Monocytes Relative: 8.2 %
Neutro Abs: 2402 {cells}/uL (ref 1500–7800)
Neutrophils Relative %: 54.6 %
Platelets: 217 Thousand/uL (ref 140–400)
RBC: 3.84 Million/uL (ref 3.80–5.10)
RDW: 13.3 % (ref 11.0–15.0)
Total Lymphocyte: 34 %
WBC: 4.4 Thousand/uL (ref 3.8–10.8)

## 2024-04-18 LAB — COMPREHENSIVE METABOLIC PANEL WITH GFR
ALT: 9 U/L (ref 0–35)
AST: 21 U/L (ref 0–37)
Albumin: 3.9 g/dL (ref 3.5–5.2)
Alkaline Phosphatase: 78 U/L (ref 39–117)
BUN: 25 mg/dL — ABNORMAL HIGH (ref 6–23)
CO2: 30 meq/L (ref 19–32)
Calcium: 8.8 mg/dL (ref 8.4–10.5)
Chloride: 103 meq/L (ref 96–112)
Creatinine, Ser: 1.15 mg/dL (ref 0.40–1.20)
GFR: 43.07 mL/min — ABNORMAL LOW (ref >60.00)
Glucose, Bld: 126 mg/dL — ABNORMAL HIGH (ref 70–99)
Potassium: 3.9 meq/L (ref 3.5–5.1)
Sodium: 142 meq/L (ref 135–145)
Total Bilirubin: 0.5 mg/dL (ref 0.2–1.2)
Total Protein: 6.7 g/dL (ref 6.0–8.3)

## 2024-04-18 LAB — MICROALBUMIN / CREATININE URINE RATIO
Creatinine,U: 45.9 mg/dL
Microalb Creat Ratio: UNDETERMINED mg/g (ref 0.0–30.0)
Microalb, Ur: <0.7 mg/dL

## 2024-04-18 LAB — IBC + FERRITIN
Ferritin: 67.7 ng/mL (ref 10.0–291.0)
Iron: 45 ug/dL (ref 42–145)
Saturation Ratios: 14.3 % — ABNORMAL LOW (ref 20.0–50.0)
TIBC: 315 ug/dL (ref 250.0–450.0)
Transferrin: 225 mg/dL (ref 212.0–360.0)

## 2024-04-18 LAB — LIPID PANEL
Cholesterol: 221 mg/dL — ABNORMAL HIGH (ref 0–200)
HDL: 72.6 mg/dL (ref >39.00)
LDL Cholesterol: 138 mg/dL — ABNORMAL HIGH (ref 0–99)
NonHDL: 148.32
Total CHOL/HDL Ratio: 3
Triglycerides: 51 mg/dL (ref 0.0–149.0)
VLDL: 10.2 mg/dL (ref 0.0–40.0)

## 2024-04-18 MED ORDER — AMLODIPINE BESYLATE 5 MG PO TABS
7.5000 mg | ORAL_TABLET | Freq: Every day | ORAL | 1 refills | Status: AC
Start: 2024-04-18 — End: ?

## 2024-04-18 MED ORDER — HYDROCHLOROTHIAZIDE 25 MG PO TABS
25.0000 mg | ORAL_TABLET | Freq: Every day | ORAL | 1 refills | Status: AC
Start: 2024-04-18 — End: ?

## 2024-04-18 MED ORDER — VALSARTAN 320 MG PO TABS
320.0000 mg | ORAL_TABLET | Freq: Every day | ORAL | 1 refills | Status: AC
Start: 2024-04-18 — End: ?

## 2024-04-18 MED ORDER — AMLODIPINE BESYLATE 5 MG PO TABS
5.0000 mg | ORAL_TABLET | Freq: Every day | ORAL | 1 refills | Status: DC
Start: 2024-04-18 — End: 2024-04-18

## 2024-04-18 NOTE — Assessment & Plan Note (Signed)
 BP always high in the office, BP is better at home but still above goal at 143/63.  Will increase amlodipine  from 5mg  daily to 7.5mg  daily. Continue Valsartan  and hydrochlorothiazide .

## 2024-04-18 NOTE — Progress Notes (Signed)
 Subjective:     Patient ID: Brandy Stevens, female    DOB: 1936/02/23, 88 y.o.   MRN: [id number]  Chief Complaint  Patient presents with   Hypertension    Here for follow up    Hypertension    Discussed the use of AI scribe software for clinical note transcription with the patient, who gave verbal consent to proceed.  History of Present Illness  Brandy Stevens is an 88 year old female with hypertension and chronic kidney disease who presents for a follow-up on her medications.  Her blood pressure is well-controlled at home, with a reading of 143/63 mmHg this morning, though it tends to be higher during office visits. She is consistent with her antihypertensive medications but stopped taking calcium, vitamin D , and iron supplements for two months due to fatigue from polypharmacy. She plans to resume these supplements.  She has chronic kidney disease and was previously referred to a nephrologist but did not attend the appointment due to family obligations. She is increasing her water intake and avoiding anti-inflammatory medications. She is not on specific medications for kidney function.  She engages in regular physical activity, including walking and line dancing. She has a supportive social network and participates in activities with her high school class. Her family history includes three brothers who are currently ill, one with Alzheimer's disease, and she is involved in their care, which is emotionally challenging.     Health Maintenance Due  Topic Date Due   Zoster Vaccines- Shingrix (1 of 2) Never done   DTaP/Tdap/Td (2 - Tdap) 08/26/2019   Influenza Vaccine  01/25/2024   COVID-19 Vaccine (6 - 2025-26 season) 02/25/2024    Past Medical History:  Diagnosis Date   Allergy    allergic rhinitis   Anemia    nos   History of chicken pox    Hypertension    Osteopenia    Systolic murmur    Normal valves on 2-d echo 5/11    Past Surgical History:  Procedure  Laterality Date   MOUTH SURGERY  05/29/2022   RIGHT OOPHORECTOMY  06/26/1966    Family History  Problem Relation Age of Onset   Other Mother        complications from hip fx   Pneumonia Father    Osteoarthritis Brother    Actinic keratosis Brother     Social History   Socioeconomic History   Marital status: Widowed    Spouse name: Not on file   Number of children: 1   Years of education: Not on file   Highest education level: Not on file  Occupational History   Occupation: Academic librarian: [employer]  Tobacco Use   Smoking status: Never   Smokeless tobacco: Never  Substance and Sexual Activity   Alcohol use: No   Drug use: No   Sexual activity: Not on file  Other Topics Concern   Not on file  Social History Narrative   7 brothers-- 1 died from unknown cause, 1 died from pneumonia. 5 still living.   1 son living- lives locally, he has a son who lives locally-    One great granddaughter   Widowed, husband died in May 04, 2006   Has two outdoor   Enjoys volunteering, Market researcher for shopping trips at [employer] Strain: Not on [employer] Insecurity: Not on file  Transportation Needs: Not on file  Physical Activity: Not on file  Stress: Not on file  Social Connections: Not on file  Intimate Partner Violence: Not on file    Outpatient Medications Prior to Visit  Medication Sig Dispense Refill   Calcium Carbonate-Vitamin D  (CALTRATE 600+D) 600-400 MG-UNIT per tablet Take 1 tablet by mouth 2 (two) times daily.     cholecalciferol (VITAMIN D ) 1000 UNITS tablet Take 2,000 Units by mouth daily.     COVID-19 mRNA vaccine 2023-2024 (COMIRNATY ) syringe Inject into the muscle. 0.3 mL 0   docusate sodium (COLACE) 100 MG capsule Take 1 capsule by mouth 1 to 2 times daily as needed.     ferrous sulfate (FERROUSUL) 325 (65 FE) MG tablet Take 325 mg by mouth daily with breakfast.      amLODipine  (NORVASC )  5 MG tablet Take 1 tablet (5 mg total) by mouth daily. 90 tablet 1   hydrochlorothiazide  (HYDRODIURIL ) 25 MG tablet Take 1 tablet (25 mg total) by mouth daily. 90 tablet 1   valsartan  (DIOVAN ) 320 MG tablet TAKE 1 TABLET(320 MG) BY MOUTH DAILY 90 tablet 1   No facility-administered medications prior to visit.    Allergies  Allergen Reactions   Ace Inhibitors Other (See Comments)    cough    ROS See HPI    Objective:    Physical Exam Constitutional:      General: She is not in acute distress.    Appearance: Normal appearance. She is well-developed.  HENT:     Head: Normocephalic and atraumatic.     Right Ear: External ear normal.     Left Ear: External ear normal.  Eyes:     General: No scleral icterus. Neck:     Thyroid: No thyromegaly.  Cardiovascular:     Rate and Rhythm: Normal rate and regular rhythm.     Heart sounds: Murmur heard.     Systolic murmur is present with a grade of 3/6.  Pulmonary:     Effort: Pulmonary effort is normal. No respiratory distress.     Breath sounds: Normal breath sounds. No wheezing.  Musculoskeletal:     Cervical back: Neck supple.     Right lower leg: 2+ Edema present.     Left lower leg: 2+ Edema present.  Skin:    General: Skin is warm and dry.  Neurological:     Mental Status: She is alert and oriented to person, place, and time.  Psychiatric:        Mood and Affect: Mood normal.        Behavior: Behavior normal.        Thought Content: Thought content normal.        Judgment: Judgment normal.      BP (!) 143/63 Comment: home reading today prior to visit  Pulse 88   Temp 98.5 F (36.9 C) (Oral)   Resp 16   Ht 5' 3 (1.6 m)   Wt 157 lb (71.2 kg)   SpO2 100%   BMI 27.81 kg/m  Wt Readings from Last 3 Encounters:  04/18/24 157 lb (71.2 kg)  10/17/23 157 lb (71.2 kg)  04/10/23 164 lb (74.4 kg)       Assessment & Plan:   Problem List Items Addressed This Visit       Unprioritized   Vitamin D  deficiency   She  plans to restart vitamin D  supplement.       Stage 3b chronic kidney disease (HCC)   Declines referral to nephrology.  Advised pt  to avoid NSAIDS and maintain good hydration/bp control.       Relevant Orders   Comp Met (CMET)   Urine Microalbumin w/creat. ratio   Osteopenia   Plan to repeat dexa in 2026, restart caltrate. Continue regular walking.       Hyperlipemia   Lab Results  Component Value Date   CHOL 229 (H) 09/18/2022   HDL 73.10 09/18/2022   LDLCALC 144 (H) 09/18/2022   TRIG 61.0 09/18/2022   CHOLHDL 3 09/18/2022   Not currently on statin.  Continue to work on diet and exercise.        Relevant Medications   hydrochlorothiazide  (HYDRODIURIL ) 25 MG tablet   valsartan  (DIOVAN ) 320 MG tablet   amLODipine  (NORVASC ) 5 MG tablet   Other Relevant Orders   Lipid panel   Essential hypertension   BP always high in the office, BP is better at home but still above goal at 143/63.  Will increase amlodipine  from 5mg  daily to 7.5mg  daily. Continue Valsartan  and hydrochlorothiazide .       Relevant Medications   hydrochlorothiazide  (HYDRODIURIL ) 25 MG tablet   valsartan  (DIOVAN ) 320 MG tablet   amLODipine  (NORVASC ) 5 MG tablet   Anemia, iron deficiency   Relevant Orders   CBC with Differential/Platelet   IBC + Ferritin   Other Visit Diagnoses       Needs flu shot    -  Primary   Relevant Orders   Flu vaccine HIGH DOSE PF(Fluzone Trivalent)       I have discontinued Elveria GRADE. Mendenhall's amLODipine . I have also changed her valsartan  and amLODipine . Additionally, I am having her maintain her docusate sodium, ferrous sulfate, Calcium Carbonate-Vitamin D , cholecalciferol, Comirnaty , and hydrochlorothiazide .  Meds ordered this encounter  Medications   hydrochlorothiazide  (HYDRODIURIL ) 25 MG tablet    Sig: Take 1 tablet (25 mg total) by mouth daily.    Dispense:  90 tablet    Refill:  1    Supervising Provider:   DOMENICA BLACKBIRD A [[id number]]   valsartan  (DIOVAN ) 320 MG tablet     Sig: Take 1 tablet (320 mg total) by mouth daily.    Dispense:  90 tablet    Refill:  1    Supervising Provider:   DOMENICA BLACKBIRD A [[id number]]   DISCONTD: amLODipine  (NORVASC ) 5 MG tablet    Sig: Take 1 tablet (5 mg total) by mouth daily.    Dispense:  90 tablet    Refill:  1    Supervising Provider:   DOMENICA BLACKBIRD A [[id number]]   amLODipine  (NORVASC ) 5 MG tablet    Sig: Take 1.5 tablets (7.5 mg total) by mouth daily.    Dispense:  135 tablet    Refill:  1    Cancel rx for 5mg .  This rx replaces    Supervising Provider:   DOMENICA BLACKBIRD A [id number]

## 2024-04-18 NOTE — Assessment & Plan Note (Signed)
 She plans to restart vitamin D  supplement.

## 2024-04-18 NOTE — Assessment & Plan Note (Signed)
 Declines referral to nephrology.  Advised pt to avoid NSAIDS and maintain good hydration/bp control.

## 2024-04-18 NOTE — Assessment & Plan Note (Addendum)
 Lab Results  Component Value Date   CHOL 229 (H) 09/18/2022   HDL 73.10 09/18/2022   LDLCALC 144 (H) 09/18/2022   TRIG 61.0 09/18/2022   CHOLHDL 3 09/18/2022   Not currently on statin.  Continue to work on diet and exercise.

## 2024-04-18 NOTE — Assessment & Plan Note (Signed)
 Plan to repeat dexa in 2026, restart caltrate. Continue regular walking.

## 2024-04-18 NOTE — Patient Instructions (Signed)
 VISIT SUMMARY:  You had a follow-up visit to review your medications and overall health, particularly focusing on your blood pressure and kidney function.  YOUR PLAN:  HYPERTENSION: Your blood pressure is generally well-controlled at home but tends to be higher during office visits. -Continue taking hydrochlorothiazide , valsartan , and amlodipine  as prescribed. -If your home blood pressure readings are higher than 140 mmHg systolic, increase amlodipine  to 7.5 mg daily. -Monitor your blood pressure at home and report the readings in 1-2 weeks. -We will reassess your condition in 3 months.  CHRONIC KIDNEY DISEASE STAGE 3: You have stage 3 chronic kidney disease, likely due to hypertension. -We discussed the possibility of starting Farxiga to help preserve kidney function, pending lab results and insurance coverage. -Please provide a urine sample to check your kidney function and determine if you are eligible for Farxiga. -Avoid taking NSAIDs (anti-inflammatory medications). -Continue to increase your water intake.  CARDIAC MURMUR: You have a long-standing heart murmur that has been stable. -We will schedule an echocardiogram for late summer next year. -Monitor for any new swelling or shortness of breath and report if these symptoms occur.

## 2024-04-20 ENCOUNTER — Ambulatory Visit: Payer: Self-pay | Admitting: Family

## 2024-04-20 ENCOUNTER — Telehealth: Payer: Self-pay | Admitting: Family

## 2024-04-20 DIAGNOSIS — D509 Iron deficiency anemia, unspecified: Secondary | ICD-10-CM

## 2024-04-20 MED ORDER — MULTI-VITAMIN/MINERALS PO TABS: 1.0000 | ORAL_TABLET | Freq: Every day | ORAL | Status: AC

## 2024-04-20 NOTE — Telephone Encounter (Signed)
 She remains anemic.  Iron a bit low.  Add multivitamin with minerals once daily.  Kidney function remains reduced but stable.   Let's have her complete and IFOB test please dx anemia.

## 2024-05-08 ENCOUNTER — Other Ambulatory Visit

## 2024-05-08 DIAGNOSIS — D509 Iron deficiency anemia, unspecified: Secondary | ICD-10-CM

## 2024-05-08 LAB — FECAL OCCULT BLOOD, IMMUNOCHEMICAL: Fecal Occult Bld: NEGATIVE

## 2024-05-09 ENCOUNTER — Ambulatory Visit: Payer: Self-pay | Admitting: Family

## 2024-06-30 DIAGNOSIS — Z780 Asymptomatic menopausal state: Secondary | ICD-10-CM

## 2024-07-18 ENCOUNTER — Ambulatory Visit: Admitting: Family

## 2024-08-06 ENCOUNTER — Ambulatory Visit: Admitting: Family
